# Patient Record
Sex: Male | Born: 1940 | Hispanic: No | Marital: Married | Smoking: Never smoker
Health system: Southern US, Community
[De-identification: ages and names within clinical notes are randomized; demographics above are authoritative.]

## PROBLEM LIST (undated history)

## (undated) DIAGNOSIS — C189 Malignant neoplasm of colon, unspecified: Secondary | ICD-10-CM

## (undated) DIAGNOSIS — E119 Type 2 diabetes mellitus without complications: Secondary | ICD-10-CM

## (undated) DIAGNOSIS — E785 Hyperlipidemia, unspecified: Secondary | ICD-10-CM

## (undated) DIAGNOSIS — I10 Essential (primary) hypertension: Secondary | ICD-10-CM

---

## 2006-05-06 ENCOUNTER — Ambulatory Visit (HOSPITAL_COMMUNITY): Admission: RE | Admit: 2006-05-06 | Discharge: 2006-05-06 | Payer: Self-pay | Admitting: General Surgery

## 2019-06-29 DIAGNOSIS — E1169 Type 2 diabetes mellitus with other specified complication: Secondary | ICD-10-CM

## 2019-06-29 DIAGNOSIS — I10 Essential (primary) hypertension: Secondary | ICD-10-CM | POA: Diagnosis not present

## 2019-06-29 DIAGNOSIS — E162 Hypoglycemia, unspecified: Secondary | ICD-10-CM

## 2019-06-29 DIAGNOSIS — E860 Dehydration: Secondary | ICD-10-CM

## 2019-06-29 DIAGNOSIS — I951 Orthostatic hypotension: Secondary | ICD-10-CM

## 2019-06-29 DIAGNOSIS — R531 Weakness: Secondary | ICD-10-CM

## 2019-06-30 DIAGNOSIS — E162 Hypoglycemia, unspecified: Secondary | ICD-10-CM | POA: Diagnosis not present

## 2019-06-30 DIAGNOSIS — I10 Essential (primary) hypertension: Secondary | ICD-10-CM | POA: Diagnosis not present

## 2019-06-30 DIAGNOSIS — R531 Weakness: Secondary | ICD-10-CM | POA: Diagnosis not present

## 2019-06-30 DIAGNOSIS — I951 Orthostatic hypotension: Secondary | ICD-10-CM | POA: Diagnosis not present

## 2019-07-01 DIAGNOSIS — I10 Essential (primary) hypertension: Secondary | ICD-10-CM | POA: Diagnosis not present

## 2019-07-01 DIAGNOSIS — R531 Weakness: Secondary | ICD-10-CM | POA: Diagnosis not present

## 2019-07-01 DIAGNOSIS — E162 Hypoglycemia, unspecified: Secondary | ICD-10-CM | POA: Diagnosis not present

## 2019-07-01 DIAGNOSIS — I951 Orthostatic hypotension: Secondary | ICD-10-CM | POA: Diagnosis not present

## 2019-07-02 DIAGNOSIS — E162 Hypoglycemia, unspecified: Secondary | ICD-10-CM | POA: Diagnosis not present

## 2019-07-02 DIAGNOSIS — R531 Weakness: Secondary | ICD-10-CM | POA: Diagnosis not present

## 2019-07-02 DIAGNOSIS — I10 Essential (primary) hypertension: Secondary | ICD-10-CM | POA: Diagnosis not present

## 2019-07-02 DIAGNOSIS — I951 Orthostatic hypotension: Secondary | ICD-10-CM | POA: Diagnosis not present

## 2019-07-03 DIAGNOSIS — I951 Orthostatic hypotension: Secondary | ICD-10-CM | POA: Diagnosis not present

## 2019-07-03 DIAGNOSIS — I10 Essential (primary) hypertension: Secondary | ICD-10-CM | POA: Diagnosis not present

## 2019-07-03 DIAGNOSIS — R531 Weakness: Secondary | ICD-10-CM | POA: Diagnosis not present

## 2019-07-03 DIAGNOSIS — E162 Hypoglycemia, unspecified: Secondary | ICD-10-CM | POA: Diagnosis not present

## 2019-07-04 ENCOUNTER — Encounter (HOSPITAL_COMMUNITY): Payer: Self-pay | Admitting: Internal Medicine

## 2019-07-04 ENCOUNTER — Inpatient Hospital Stay (HOSPITAL_COMMUNITY)
Admission: EM | Admit: 2019-07-04 | Discharge: 2019-07-30 | DRG: 207 | Disposition: E | Payer: Medicare HMO | Source: Other Acute Inpatient Hospital | Attending: Critical Care Medicine | Admitting: Critical Care Medicine

## 2019-07-04 DIAGNOSIS — Z9289 Personal history of other medical treatment: Secondary | ICD-10-CM

## 2019-07-04 DIAGNOSIS — Z9911 Dependence on respirator [ventilator] status: Secondary | ICD-10-CM

## 2019-07-04 DIAGNOSIS — F05 Delirium due to known physiological condition: Secondary | ICD-10-CM

## 2019-07-04 DIAGNOSIS — Z7189 Other specified counseling: Secondary | ICD-10-CM | POA: Diagnosis not present

## 2019-07-04 DIAGNOSIS — T380X5A Adverse effect of glucocorticoids and synthetic analogues, initial encounter: Secondary | ICD-10-CM | POA: Diagnosis not present

## 2019-07-04 DIAGNOSIS — R6521 Severe sepsis with septic shock: Secondary | ICD-10-CM | POA: Diagnosis not present

## 2019-07-04 DIAGNOSIS — A414 Sepsis due to anaerobes: Secondary | ICD-10-CM | POA: Diagnosis not present

## 2019-07-04 DIAGNOSIS — Z781 Physical restraint status: Secondary | ICD-10-CM

## 2019-07-04 DIAGNOSIS — Z6833 Body mass index (BMI) 33.0-33.9, adult: Secondary | ICD-10-CM | POA: Diagnosis not present

## 2019-07-04 DIAGNOSIS — J150 Pneumonia due to Klebsiella pneumoniae: Secondary | ICD-10-CM | POA: Diagnosis not present

## 2019-07-04 DIAGNOSIS — Z0189 Encounter for other specified special examinations: Secondary | ICD-10-CM

## 2019-07-04 DIAGNOSIS — A419 Sepsis, unspecified organism: Secondary | ICD-10-CM | POA: Diagnosis not present

## 2019-07-04 DIAGNOSIS — I4891 Unspecified atrial fibrillation: Secondary | ICD-10-CM | POA: Diagnosis not present

## 2019-07-04 DIAGNOSIS — G9341 Metabolic encephalopathy: Secondary | ICD-10-CM | POA: Diagnosis present

## 2019-07-04 DIAGNOSIS — N179 Acute kidney failure, unspecified: Secondary | ICD-10-CM | POA: Diagnosis present

## 2019-07-04 DIAGNOSIS — Z85038 Personal history of other malignant neoplasm of large intestine: Secondary | ICD-10-CM | POA: Diagnosis not present

## 2019-07-04 DIAGNOSIS — I10 Essential (primary) hypertension: Secondary | ICD-10-CM | POA: Diagnosis present

## 2019-07-04 DIAGNOSIS — U071 COVID-19: Principal | ICD-10-CM | POA: Diagnosis present

## 2019-07-04 DIAGNOSIS — D649 Anemia, unspecified: Secondary | ICD-10-CM | POA: Diagnosis not present

## 2019-07-04 DIAGNOSIS — E785 Hyperlipidemia, unspecified: Secondary | ICD-10-CM | POA: Diagnosis present

## 2019-07-04 DIAGNOSIS — Z515 Encounter for palliative care: Secondary | ICD-10-CM | POA: Diagnosis not present

## 2019-07-04 DIAGNOSIS — I951 Orthostatic hypotension: Secondary | ICD-10-CM | POA: Diagnosis not present

## 2019-07-04 DIAGNOSIS — E1165 Type 2 diabetes mellitus with hyperglycemia: Secondary | ICD-10-CM | POA: Diagnosis present

## 2019-07-04 DIAGNOSIS — E8881 Metabolic syndrome: Secondary | ICD-10-CM | POA: Diagnosis present

## 2019-07-04 DIAGNOSIS — E1140 Type 2 diabetes mellitus with diabetic neuropathy, unspecified: Secondary | ICD-10-CM | POA: Diagnosis present

## 2019-07-04 DIAGNOSIS — E162 Hypoglycemia, unspecified: Secondary | ICD-10-CM | POA: Diagnosis not present

## 2019-07-04 DIAGNOSIS — Z66 Do not resuscitate: Secondary | ICD-10-CM | POA: Diagnosis not present

## 2019-07-04 DIAGNOSIS — E669 Obesity, unspecified: Secondary | ICD-10-CM | POA: Diagnosis present

## 2019-07-04 DIAGNOSIS — E870 Hyperosmolality and hypernatremia: Secondary | ICD-10-CM | POA: Diagnosis not present

## 2019-07-04 DIAGNOSIS — E11649 Type 2 diabetes mellitus with hypoglycemia without coma: Secondary | ICD-10-CM | POA: Diagnosis present

## 2019-07-04 DIAGNOSIS — R001 Bradycardia, unspecified: Secondary | ICD-10-CM | POA: Diagnosis not present

## 2019-07-04 DIAGNOSIS — R0902 Hypoxemia: Secondary | ICD-10-CM

## 2019-07-04 DIAGNOSIS — J1282 Pneumonia due to coronavirus disease 2019: Secondary | ICD-10-CM | POA: Diagnosis present

## 2019-07-04 DIAGNOSIS — Z4659 Encounter for fitting and adjustment of other gastrointestinal appliance and device: Secondary | ICD-10-CM

## 2019-07-04 DIAGNOSIS — E874 Mixed disorder of acid-base balance: Secondary | ICD-10-CM | POA: Diagnosis not present

## 2019-07-04 DIAGNOSIS — D6489 Other specified anemias: Secondary | ICD-10-CM | POA: Diagnosis not present

## 2019-07-04 DIAGNOSIS — J9690 Respiratory failure, unspecified, unspecified whether with hypoxia or hypercapnia: Secondary | ICD-10-CM

## 2019-07-04 DIAGNOSIS — E119 Type 2 diabetes mellitus without complications: Secondary | ICD-10-CM

## 2019-07-04 DIAGNOSIS — J9602 Acute respiratory failure with hypercapnia: Secondary | ICD-10-CM | POA: Diagnosis not present

## 2019-07-04 DIAGNOSIS — J80 Acute respiratory distress syndrome: Secondary | ICD-10-CM | POA: Diagnosis present

## 2019-07-04 DIAGNOSIS — J9601 Acute respiratory failure with hypoxia: Secondary | ICD-10-CM | POA: Diagnosis not present

## 2019-07-04 DIAGNOSIS — E875 Hyperkalemia: Secondary | ICD-10-CM | POA: Diagnosis not present

## 2019-07-04 DIAGNOSIS — Z452 Encounter for adjustment and management of vascular access device: Secondary | ICD-10-CM

## 2019-07-04 DIAGNOSIS — R531 Weakness: Secondary | ICD-10-CM | POA: Diagnosis not present

## 2019-07-04 DIAGNOSIS — J156 Pneumonia due to other aerobic Gram-negative bacteria: Secondary | ICD-10-CM | POA: Diagnosis not present

## 2019-07-04 DIAGNOSIS — J398 Other specified diseases of upper respiratory tract: Secondary | ICD-10-CM

## 2019-07-04 DIAGNOSIS — Z978 Presence of other specified devices: Secondary | ICD-10-CM

## 2019-07-04 DIAGNOSIS — R579 Shock, unspecified: Secondary | ICD-10-CM | POA: Diagnosis not present

## 2019-07-04 DIAGNOSIS — K5900 Constipation, unspecified: Secondary | ICD-10-CM | POA: Diagnosis not present

## 2019-07-04 DIAGNOSIS — J9600 Acute respiratory failure, unspecified whether with hypoxia or hypercapnia: Secondary | ICD-10-CM

## 2019-07-04 HISTORY — DX: Type 2 diabetes mellitus without complications: E11.9

## 2019-07-04 HISTORY — DX: Essential (primary) hypertension: I10

## 2019-07-04 HISTORY — DX: Hyperlipidemia, unspecified: E78.5

## 2019-07-04 HISTORY — DX: Malignant neoplasm of colon, unspecified: C18.9

## 2019-07-04 LAB — HEMOGLOBIN A1C
Hgb A1c MFr Bld: 8 % — ABNORMAL HIGH (ref 4.8–5.6)
Mean Plasma Glucose: 182.9 mg/dL

## 2019-07-04 LAB — GLUCOSE, CAPILLARY
Glucose-Capillary: 244 mg/dL — ABNORMAL HIGH (ref 70–99)
Glucose-Capillary: 200 mg/dL — ABNORMAL HIGH (ref 70–99)
Glucose-Capillary: 205 mg/dL — ABNORMAL HIGH (ref 70–99)
Glucose-Capillary: 221 mg/dL — ABNORMAL HIGH (ref 70–99)

## 2019-07-04 LAB — CBC WITH DIFFERENTIAL/PLATELET
Abs Immature Granulocytes: 0.11 10*3/uL — ABNORMAL HIGH (ref 0.00–0.07)
Basophils Absolute: 0 10*3/uL (ref 0.0–0.1)
Basophils Relative: 0 %
Eosinophils Absolute: 0 10*3/uL (ref 0.0–0.5)
Eosinophils Relative: 0 %
HCT: 37 % — ABNORMAL LOW (ref 39.0–52.0)
Hemoglobin: 12 g/dL — ABNORMAL LOW (ref 13.0–17.0)
Immature Granulocytes: 3 %
Lymphocytes Relative: 16 %
Lymphs Abs: 0.6 10*3/uL — ABNORMAL LOW (ref 0.7–4.0)
MCH: 31.2 pg (ref 26.0–34.0)
MCHC: 32.4 g/dL (ref 30.0–36.0)
MCV: 96.1 fL (ref 80.0–100.0)
Monocytes Absolute: 0.3 10*3/uL (ref 0.1–1.0)
Monocytes Relative: 7 %
Neutro Abs: 3 10*3/uL (ref 1.7–7.7)
Neutrophils Relative %: 74 %
Platelets: 229 10*3/uL (ref 150–400)
RBC: 3.85 MIL/uL — ABNORMAL LOW (ref 4.22–5.81)
RDW: 14.5 % (ref 11.5–15.5)
WBC: 4.1 10*3/uL (ref 4.0–10.5)
nRBC: 0.5 % — ABNORMAL HIGH (ref 0.0–0.2)

## 2019-07-04 LAB — COMPREHENSIVE METABOLIC PANEL
ALT: 26 U/L (ref 0–44)
AST: 39 U/L (ref 15–41)
Albumin: 2.9 g/dL — ABNORMAL LOW (ref 3.5–5.0)
Alkaline Phosphatase: 40 U/L (ref 38–126)
Anion gap: 10 (ref 5–15)
BUN: 16 mg/dL (ref 8–23)
CO2: 26 mmol/L (ref 22–32)
Calcium: 7.9 mg/dL — ABNORMAL LOW (ref 8.9–10.3)
Chloride: 103 mmol/L (ref 98–111)
Creatinine, Ser: 0.85 mg/dL (ref 0.61–1.24)
GFR calc Af Amer: >60 mL/min (ref >60)
GFR calc non Af Amer: >60 mL/min (ref >60)
Glucose, Bld: 264 mg/dL — ABNORMAL HIGH (ref 70–99)
Potassium: 4.1 mmol/L (ref 3.5–5.1)
Sodium: 139 mmol/L (ref 135–145)
Total Bilirubin: 0.7 mg/dL (ref 0.3–1.2)
Total Protein: 5.9 g/dL — ABNORMAL LOW (ref 6.5–8.1)

## 2019-07-04 LAB — D-DIMER, QUANTITATIVE: D-Dimer, Quant: 0.47 ug/mL-FEU (ref 0.00–0.50)

## 2019-07-04 LAB — PROCALCITONIN: Procalcitonin: <0.1 ng/mL

## 2019-07-04 LAB — ABO/RH: ABO/RH(D): B POS

## 2019-07-04 LAB — MRSA PCR SCREENING: MRSA by PCR: NEGATIVE

## 2019-07-04 LAB — C-REACTIVE PROTEIN: CRP: 1.6 mg/dL — ABNORMAL HIGH (ref <1.0)

## 2019-07-04 MED ORDER — HYDROCOD POLST-CPM POLST ER 10-8 MG/5ML PO SUER: 5.0000 mL | Freq: Two times a day (BID) | ORAL | Status: DC | PRN

## 2019-07-04 MED ORDER — ACETAMINOPHEN 325 MG PO TABS: 650.0000 mg | ORAL_TABLET | Freq: Four times a day (QID) | ORAL | Status: DC | PRN

## 2019-07-04 MED ORDER — ORAL CARE MOUTH RINSE
15.0000 mL | Freq: Two times a day (BID) | OROMUCOSAL | Status: DC
  Administered 2019-07-04 – 2019-07-07 (×6): 15 mL via OROMUCOSAL

## 2019-07-04 MED ORDER — SODIUM CHLORIDE 0.9 % IV SOLN: 100.0000 mg | Freq: Every day | INTRAVENOUS | Status: DC

## 2019-07-04 MED ORDER — ONDANSETRON HCL 4 MG/2ML IJ SOLN: 4.0000 mg | Freq: Four times a day (QID) | INTRAMUSCULAR | Status: DC | PRN

## 2019-07-04 MED ORDER — SODIUM CHLORIDE 0.9 % IV SOLN: 100.0000 mg | INTRAVENOUS | Status: DC

## 2019-07-04 MED ORDER — GUAIFENESIN-DM 100-10 MG/5ML PO SYRP: 10.0000 mL | ORAL_SOLUTION | ORAL | Status: DC | PRN

## 2019-07-04 MED ORDER — ONDANSETRON HCL 4 MG PO TABS: 4.0000 mg | ORAL_TABLET | Freq: Four times a day (QID) | ORAL | Status: DC | PRN

## 2019-07-04 MED ORDER — ENOXAPARIN SODIUM 60 MG/0.6ML ~~LOC~~ SOLN
0.5000 mg/kg | Freq: Two times a day (BID) | SUBCUTANEOUS | Status: DC
  Administered 2019-07-04 – 2019-07-23 (×39): 60 mg via SUBCUTANEOUS
  Filled 2019-07-04 (×40): qty 0.6

## 2019-07-04 MED ORDER — CHLORHEXIDINE GLUCONATE 0.12 % MT SOLN
15.0000 mL | Freq: Two times a day (BID) | OROMUCOSAL | Status: DC
  Administered 2019-07-04 – 2019-07-07 (×5): 15 mL via OROMUCOSAL
  Filled 2019-07-04 (×6): qty 15

## 2019-07-04 MED ORDER — SODIUM CHLORIDE 0.9 % IV SOLN
INTRAVENOUS | Status: DC | PRN
  Administered 2019-07-04: 1000 mL via INTRAVENOUS
  Administered 2019-07-08: 11:00:00 250 mL via INTRAVENOUS
  Administered 2019-07-14: 500 mL via INTRAVENOUS

## 2019-07-04 MED ORDER — CHLORHEXIDINE GLUCONATE CLOTH 2 % EX PADS
6.0000 | MEDICATED_PAD | Freq: Every day | CUTANEOUS | Status: DC
  Administered 2019-07-04 – 2019-07-23 (×19): 6 via TOPICAL

## 2019-07-04 MED ORDER — HALOPERIDOL LACTATE 5 MG/ML IJ SOLN
2.0000 mg | Freq: Once | INTRAMUSCULAR | Status: AC
  Administered 2019-07-04: 2 mg via INTRAVENOUS
  Filled 2019-07-04: qty 1

## 2019-07-04 MED ORDER — INSULIN ASPART 100 UNIT/ML ~~LOC~~ SOLN
0.0000 [IU] | SUBCUTANEOUS | Status: DC
  Administered 2019-07-04: 5 [IU] via SUBCUTANEOUS
  Administered 2019-07-04: 3 [IU] via SUBCUTANEOUS
  Administered 2019-07-04 – 2019-07-05 (×3): 5 [IU] via SUBCUTANEOUS
  Administered 2019-07-05 (×3): 3 [IU] via SUBCUTANEOUS
  Administered 2019-07-05: 5 [IU] via SUBCUTANEOUS
  Administered 2019-07-05 (×2): 2 [IU] via SUBCUTANEOUS
  Administered 2019-07-06: 8 [IU] via SUBCUTANEOUS
  Administered 2019-07-06: 5 [IU] via SUBCUTANEOUS
  Administered 2019-07-06 (×4): 8 [IU] via SUBCUTANEOUS
  Administered 2019-07-07: 11 [IU] via SUBCUTANEOUS
  Administered 2019-07-07: 5 [IU] via SUBCUTANEOUS

## 2019-07-04 MED ORDER — SODIUM CHLORIDE 0.9 % IV SOLN
100.0000 mg | Freq: Once | INTRAVENOUS | Status: AC
  Administered 2019-07-04: 100 mg via INTRAVENOUS
  Filled 2019-07-04: qty 20

## 2019-07-04 MED ORDER — HYDRALAZINE HCL 20 MG/ML IJ SOLN
5.0000 mg | Freq: Four times a day (QID) | INTRAMUSCULAR | Status: DC | PRN
  Administered 2019-07-05: 5 mg via INTRAVENOUS
  Filled 2019-07-04: qty 1

## 2019-07-04 MED ORDER — PRAVASTATIN SODIUM 20 MG PO TABS
40.0000 mg | ORAL_TABLET | Freq: Every day | ORAL | Status: DC
  Administered 2019-07-04 – 2019-07-07 (×3): 40 mg via ORAL
  Filled 2019-07-04 (×4): qty 2

## 2019-07-04 MED ORDER — DEXAMETHASONE SODIUM PHOSPHATE 10 MG/ML IJ SOLN
6.0000 mg | INTRAMUSCULAR | Status: AC
  Administered 2019-07-04 – 2019-07-13 (×10): 6 mg via INTRAVENOUS
  Filled 2019-07-04 (×10): qty 1

## 2019-07-04 NOTE — Progress Notes (Signed)
[facility]  PROGRESS NOTE  Bruce Weaver [id number] DOB: [birth year] DOA: 07/06/2019 PCP: No primary care provider on file. Admit date - 07/19/2019   Admitting Physician Etta Quill, DO  Outpatient Primary MD for the patient is No primary care provider on file.  LOS - 0 Brief Narrative   Bruce Weaver is a 79 y.o. year old male with medical history significant for HTN, T2DM, HLD  who presented on 07/14/2019 as a transfer from [place]. He presented to [facility] on 3/21 with days of generalized weakness and lightheadedness with standing since getting the firs series of COVID vaccine shots  On 06/25/19.   At the ED at [facility] notable for afebrile with BP 99/54 with positive orthostasis and normal oxygen saturation on rom air. Lab work significant for glucose of 65, WBC 2.9, and creatinine of 1.5 CXR with no acute abnormalities since getting the firs series of COVID vaccine shots  On 06/25/19  He was admitted with working diagnosis of hypoglycemia and orthostatic hypotension related to dehydration with AKI for which his home BP meds and oral hypoglycemics were held and given fluids. CT head and MRI brain obtained for weakness, tremors and confusion were unremarkable    Hospital course complicated worsening hypoxia requiring  2L and found to have multifocal pneumonia on CXR initially started on empiric ceftriaxone and azithromycin but given leukopenia and elevated CRP of 36.8 ( (26.8 on 3/5) and LDH of 717 COVID screen was obtained which was positive. He was started on IV decadron and Iv remdesivir on 3/2, Lovenox increased to full dose given significant d-dimer elevation (730 on 07/03/19).  Patient transferred to [facility] due to increasing O2 requirements (from 4 L to 15 L in 24 hours prior to transfer). CTA chest obtained on 3/6 prior to transfer negative for PE and consistent with bilateral ground glass opacities with superimposed consolidation. .    Subjective  Today he states his  breathing is much better. Mild cough occasionally productive. No fevers, no diarrhea, no abdominal pain or chest pain  A & P   Acute hypoxic respiratory failure secondary to COVID Pneumonia, stable Currently on NRB and maintaining sats greater than 90%.  Very comfortable on exam, no tachypnea, no work of breathing.  Inflammatory markers minimally elevated (CRP 1.6, D-dimer within normal limits) - closely monitor respiratory status in SDU, wean O2 as able given normal respiratory effort, low threshold to consult PCCM but given stability will continue to monitor -Encourage proning, nurse to assist, flutter valve and incentive spirometry as able -Continue on remdesivir, Decadron, no current indication for Actemra -Daily CRP, D-dimer -We will de-escalate to prophylactic Lovenox dosing given D-dimer is not elevated  Type 2 diabetes, presented with hypoglycemic episodes, A1c 8 (3/21) Fasting glucose in the 200s, in setting of IV steroid use -Holding home oral hypoglycemics -Continue sliding scale, once able to eat will add mealtime coverage as well, monitor CBGs  Hypertension Admitted with concern for orthostatic hypotension, blood pressure now elevated in the 150s to 170s -Once able to tolerate oral will resume home losartan -IV hydralazine as needed  AKI, resolved Creatinine slightly elevated on admission, now resolved -Held losartan on admission, will resume given elevated BP once able to tolerate p.o.      Family Communication  :  Called daughter Aline Brochure [phone]  Code Status : Full code  Disposition Plan  :  Patient is from home. Anticipated d/c date: 2 to 3 days. Barriers to d/c or necessity for inpatient  status:  Currently requiring high level of oxygen will need to wean to more appropriate regimen before able to go home, receiving an remdesivir and Decadron, may still need further medications if O2 requirements remain high. Consults  : None  Procedures  : None  DVT  Prophylaxis  :  Lovenox-prophylactic dosing  Lab Results  Component Value Date   PLT 229 07/07/2019    Diet :  Diet Order            Diet NPO time specified Except for: Sips with Meds, Ice Chips  Diet effective now               Inpatient Medications Scheduled Meds: . chlorhexidine  15 mL Mouth Rinse BID  . Chlorhexidine Gluconate Cloth  6 each Topical Daily  . dexamethasone (DECADRON) injection  6 mg Intravenous Q24H  . insulin aspart  0-15 Units Subcutaneous Q4H  . mouth rinse  15 mL Mouth Rinse q12n4p  . pravastatin  40 mg Oral q1800   Continuous Infusions: . remdesivir 100 mg in NS 100 mL     PRN Meds:.acetaminophen, chlorpheniramine-HYDROcodone, guaiFENesin-dextromethorphan, ondansetron **OR** ondansetron (ZOFRAN) IV  Antibiotics  :   Anti-infectives (From admission, onward)   Start     Dose/Rate Route Frequency Ordered Stop   07/05/19 1000  remdesivir 100 mg in sodium chloride 0.9 % 100 mL IVPB  Status:  Discontinued     100 mg 200 mL/hr over 30 Minutes Intravenous Daily 07/22/2019 0620 07/24/2019 0637   07/25/2019 1000  remdesivir 100 mg in sodium chloride 0.9 % 100 mL IVPB     100 mg 200 mL/hr over 30 Minutes Intravenous  Once 07/06/2019 0637     07/22/2019 0630  remdesivir 100 mg in sodium chloride 0.9 % 100 mL IVPB  Status:  Discontinued     100 mg 200 mL/hr over 30 Minutes Intravenous Every 1 hr x 2 07/27/2019 0620 07/02/2019 0636       Objective   Vitals:   07/13/2019 0600 07/09/2019 0700 07/17/2019 0800 07/27/2019 0846  BP: (!) 158/57 (!) 149/132 (!) 170/71   Pulse: 80 81 79   Resp: 16 16 (!) 22   Temp: 97.6 F (36.4 C)   98.1 F (36.7 C)  TempSrc: Oral   Oral  SpO2: (!) 88% 95% 93%   Weight: 120.7 kg       SpO2: 93 % O2 Flow Rate (L/min): 15 L/min  Wt Readings from Last 3 Encounters:  07/03/2019 120.7 kg     Intake/Output Summary (Last 24 hours) at 07/13/2019 0853 Last data filed at 07/03/2019 [id number] Gross per 24 hour  Intake --  Output 200 ml  Net -200 ml     Physical Exam:    Older male, resting comfortably in bed Awake Alert, Oriented X 3, Normal affect Moving all extremities without deficits [place].AT, Normal respiratory effort on 15 L NRB with SPO2 range 90-94, fine crackles at bases, no accessory muscle use, able to speak in complete sentences +ve B.Sounds, Abd Soft, No tenderness, No rebound, guarding or rigidity. No Cyanosis, No new Rash or bruise     I have personally reviewed the following:   Data Reviewed:  CBC Recent Labs  Lab 07/27/2019 0724  WBC 4.1  HGB 12.0*  HCT 37.0*  PLT 229  MCV 96.1  MCH 31.2  MCHC 32.4  RDW 14.5  LYMPHSABS 0.6*  MONOABS 0.3  EOSABS 0.0  BASOSABS 0.0    Chemistries  Recent Labs  Lab  07/08/2019 0724  NA 139  K 4.1  CL 103  CO2 26  GLUCOSE 264*  BUN 16  CREATININE 0.85  CALCIUM 7.9*  AST 39  ALT 26  ALKPHOS 40  BILITOT 0.7   ------------------------------------------------------------------------------------------------------------------ No results for input(s): CHOL, HDL, LDLCALC, TRIG, CHOLHDL, LDLDIRECT in the last 72 hours.  No results found for: HGBA1C ------------------------------------------------------------------------------------------------------------------ No results for input(s): TSH, T4TOTAL, T3FREE, THYROIDAB in the last 72 hours.  Invalid input(s): FREET3 ------------------------------------------------------------------------------------------------------------------ No results for input(s): VITAMINB12, FOLATE, FERRITIN, TIBC, IRON, RETICCTPCT in the last 72 hours.  Coagulation profile No results for input(s): INR, PROTIME in the last 168 hours.  Recent Labs    07/22/2019 0724  DDIMER 0.47    Cardiac Enzymes No results for input(s): CKMB, TROPONINI, MYOGLOBIN in the last 168 hours.  Invalid input(s): CK ------------------------------------------------------------------------------------------------------------------ No results found for:  BNP  Micro Results No results found for this or any previous visit (from the past 240 hour(s)).  Radiology Reports No results found.   Time Spent in minutes  30     Desiree Hane M.D on 07/19/2019 at 8:53 AM  To page go to [email or web address] - password [facility]

## 2019-07-04 NOTE — H&P (Signed)
History and Physical    Bruce Weaver [id number] DOB: 1940/10/04 DOA: 07/11/2019  PCP: No primary care provider on file.  Patient coming from: [facility] transfer  I have personally briefly reviewed patient's old medical records in [facility]  Chief Complaint: Worsening O2 requirement, COVID  HPI: Bruce Weaver is a 79 y.o. male with medical history significant of DM2, HTN, HLD.  Pt got COVID vaccine on 2/25.  Generalized weakness, fatigue and symptoms worsening since around that time.  Admitted to [facility] on 3/1.  Found to have multifocal PNA and tested COVID positive on 3/2.  Started on remdesivir and steroids.  CRP hasnt been high enough at [facility] to indicate Actemra.  Unfortunatly patient took a turn for the worse with profound O2 requirement increase over the day yesterday 3/5.  Starting day on 4L and ending day on 15L.  CTA chest done just before transfer was neg for PE, just showed COVID-19 pneumonia.  Patient transferred due to worsening O2 requirements.    Review of Systems: As per HPI, otherwise all review of systems negative.  Past Medical History:  Diagnosis Date  . Colon cancer (Bruce Weaver)   . DM2 (diabetes mellitus, type 2) (Bruce Weaver)   . HLD (hyperlipidemia)   . HTN (hypertension)     History reviewed. No pertinent surgical history.   reports that he has never smoked. He does not have any smokeless tobacco history on file. He reports that he does not drink alcohol or use drugs.  No Known Allergies  Family History  Problem Relation Age of Onset  . Diabetes Other   . Cancer Other   . Heart disease Other      Prior to Admission medications   Not on File    Physical Exam: Vitals:   07/11/2019 0530  SpO2: 92%    Constitutional: NAD, calm, comfortable Eyes: PERRL, lids and conjunctivae normal ENMT: Mucous membranes are moist. Posterior pharynx clear of any exudate or lesions.Normal dentition.  Neck: normal, supple, no masses, no thyromegaly Respiratory: clear  to auscultation bilaterally, no wheezing, no crackles. Normal respiratory effort. No accessory muscle use.  Cardiovascular: Regular rate and rhythm, no murmurs / rubs / gallops. No extremity edema. 2+ pedal pulses. No carotid bruits.  Abdomen: no tenderness, no masses palpated. No hepatosplenomegaly. Bowel sounds positive.  Musculoskeletal: no clubbing / cyanosis. No joint deformity upper and lower extremities. Good ROM, no contractures. Normal muscle tone.  Skin: no rashes, lesions, ulcers. No induration Neurologic: CN 2-12 grossly intact. Sensation intact, DTR normal. Strength 5/5 in all 4.  Psychiatric: Normal judgment and insight. Alert and oriented x 3. Normal mood.    Labs on Admission: I have personally reviewed following labs and imaging studies  CBC: No results for input(s): WBC, NEUTROABS, HGB, HCT, MCV, PLT in the last 168 hours. Basic Metabolic Panel: No results for input(s): NA, K, CL, CO2, GLUCOSE, BUN, CREATININE, CALCIUM, MG, PHOS in the last 168 hours. GFR: CrCl cannot be calculated (No successful lab value found.). Liver Function Tests: No results for input(s): AST, ALT, ALKPHOS, BILITOT, PROT, ALBUMIN in the last 168 hours. No results for input(s): LIPASE, AMYLASE in the last 168 hours. No results for input(s): AMMONIA in the last 168 hours. Coagulation Profile: No results for input(s): INR, PROTIME in the last 168 hours. Cardiac Enzymes: No results for input(s): CKTOTAL, CKMB, CKMBINDEX, TROPONINI in the last 168 hours. BNP (last 3 results) No results for input(s): PROBNP in the last 8760 hours. HbA1C: No results  for input(s): HGBA1C in the last 72 hours. CBG: No results for input(s): GLUCAP in the last 168 hours. Lipid Profile: No results for input(s): CHOL, HDL, LDLCALC, TRIG, CHOLHDL, LDLDIRECT in the last 72 hours. Thyroid Function Tests: No results for input(s): TSH, T4TOTAL, FREET4, T3FREE, THYROIDAB in the last 72 hours. Anemia Panel: No results for  input(s): VITAMINB12, FOLATE, FERRITIN, TIBC, IRON, RETICCTPCT in the last 72 hours. Urine analysis: No results found for: COLORURINE, APPEARANCEUR, LABSPEC, PHURINE, GLUCOSEU, HGBUR, BILIRUBINUR, KETONESUR, PROTEINUR, UROBILINOGEN, NITRITE, LEUKOCYTESUR  Radiological Exams on Admission: No results found.  EKG: Independently reviewed.  Assessment/Plan Principal Problem:   Acute respiratory distress syndrome (ARDS) due to COVID-19 virus (HCC) Active Problems:   DM2 (diabetes mellitus, type 2) (HCC)   HTN (hypertension)    1. ARDS due to COVID-19 1. COVID pathway 2. Satting upper 80s on 15L, no respiratory distress nor tachypnea currently 3. May need addition of HFNC shortly 4. Cont remdesivir 5. Cont Decadron 6. Checking repeat labs 7. If CRP elevated, start Actemra (wasn't high enough to indicate this as of yesterday [facility] labs). 8. CTA chest done just a couple of hours ago showed no PE, just COVID PNA. 9. Likely will want to get PCCM consult when they come in this morning in 45 mins 10. NPO currently but not putting on IVF due to poor respiratory status 2. DM2 - 1. Hold home PO hypoglycemics 2. Mod scale SSI Q4H 3. HTN - 1. Home ARB on hold 4. AKI on admission - 1. Reportedly resolved while at [facility], had mild AKI with creat of 1.5, got hydration, AKI resolved.  DVT prophylaxis: Lovenox - does per COVID protocol by pharmd Code Status: Full Code Family Communication: No family in room Disposition Plan: TBD Consults called: None Admission status: Admit to inpatient  Severity of Illness: The appropriate patient status for this patient is INPATIENT. Inpatient status is judged to be reasonable and necessary in order to provide the required intensity of service to ensure the patient's safety. The patient's presenting symptoms, physical exam findings, and initial radiographic and laboratory data in the context of their chronic comorbidities is felt to place them at high risk for  further clinical deterioration. Furthermore, it is not anticipated that the patient will be medically stable for discharge from the hospital within 2 midnights of admission. The following factors support the patient status of inpatient.   IP status due to acute resp failure (ARDS actually) from COVID-19.  * I certify that at the point of admission it is my clinical judgment that the patient will require inpatient hospital care spanning beyond 2 midnights from the point of admission due to high intensity of service, high risk for further deterioration and high frequency of surveillance required.*    Stewart Pimenta M. DO [facility]  How to contact the [facility] Attending or Consulting provider [place] or covering provider during after hours [place], for this patient?  1. Check the care team in [facility] and look for a) attending/consulting [facility] provider listed and b) the [facility] team listed 2. Log into [email or web address]  Amion Physician Scheduling and messaging for groups and whole hospitals  On call and physician scheduling software for group practices, residents, hospitalists and other medical providers for call, clinic, rotation and shift schedules. On[employer] is a hospital-wide system for scheduling doctors and paging doctors on call. EasyPlot is for scientific plotting and data analysis.  [email or web address]  and use [place]'s universal password to access. If you do  not have the password, please contact the hospital operator.  3. Locate the [facility] provider you are looking for under [facility] and page to a number that you can be directly reached. 4. If you still have difficulty reaching the provider, please page the [facility] (Director on Call) for the Hospitalists listed on amion for assistance.  07/25/2019, 6:14 AM

## 2019-07-04 NOTE — Progress Notes (Signed)
Rx Brief note: Remdesivir and Lovenox  Patient is a transfer from [facility]. I spoke with a pharmacist at [facility] who confirmed today will be his last day of Remdesivir. Was started 3/2 with 200 mg x1 then 100 mg on 3/3, 3/4 and 3/5. Patient was also on full dose Lovenox 120 mg sq q12h. LD was 3/5 2040. Appears Lovenox was increased to full dose when his d-dimer increased on 3/3. His CTA was negative for PE prior to transfer to [facility]. MD has asked pharmacy to dose Lovenox for patient with elevated d-dimer and + Covid.   Plan: F/u d-dimer and order appropriate Lovenox dose based on d-dimer and weight.   Thanks Dorrene German 07/18/2019 7:04 AM

## 2019-07-04 NOTE — Progress Notes (Signed)
Pharmacy Brief Note -   Pharmacy consulted to dose enoxaparin for VTE ppx in COVID + patient.   Pt transferred from [facility] where he was on therapeutic LMWH. D-dimer reportedly peaked there at 1.02. CTA negative for PE. LD LMWH PTA was 3/5 @ 2040.   TBW 120.7 kg D-dimer 0.47  Start enoxaparin 0.5 mg/kg subQ BID for DVT ppx. Discussed with MD - no therapeutic AC indicated at this time.  Lenis Noon, PharmD 07/08/2019 9:53 AM

## 2019-07-05 ENCOUNTER — Inpatient Hospital Stay (HOSPITAL_COMMUNITY): Payer: Medicare HMO

## 2019-07-05 DIAGNOSIS — U071 COVID-19: Principal | ICD-10-CM

## 2019-07-05 DIAGNOSIS — J80 Acute respiratory distress syndrome: Secondary | ICD-10-CM

## 2019-07-05 DIAGNOSIS — F05 Delirium due to known physiological condition: Secondary | ICD-10-CM

## 2019-07-05 LAB — COMPREHENSIVE METABOLIC PANEL
ALT: 32 U/L (ref 0–44)
AST: 55 U/L — ABNORMAL HIGH (ref 15–41)
Albumin: 3.1 g/dL — ABNORMAL LOW (ref 3.5–5.0)
Alkaline Phosphatase: 40 U/L (ref 38–126)
Anion gap: 12 (ref 5–15)
BUN: 15 mg/dL (ref 8–23)
CO2: 24 mmol/L (ref 22–32)
Calcium: 8 mg/dL — ABNORMAL LOW (ref 8.9–10.3)
Chloride: 104 mmol/L (ref 98–111)
Creatinine, Ser: 0.93 mg/dL (ref 0.61–1.24)
GFR calc Af Amer: >60 mL/min (ref >60)
GFR calc non Af Amer: >60 mL/min (ref >60)
Glucose, Bld: 165 mg/dL — ABNORMAL HIGH (ref 70–99)
Potassium: 4.1 mmol/L (ref 3.5–5.1)
Sodium: 140 mmol/L (ref 135–145)
Total Bilirubin: 1.6 mg/dL — ABNORMAL HIGH (ref 0.3–1.2)
Total Protein: 6.2 g/dL — ABNORMAL LOW (ref 6.5–8.1)

## 2019-07-05 LAB — CBC WITH DIFFERENTIAL/PLATELET
Abs Immature Granulocytes: 0.09 10*3/uL — ABNORMAL HIGH (ref 0.00–0.07)
Basophils Absolute: 0 10*3/uL (ref 0.0–0.1)
Basophils Relative: 1 %
Eosinophils Absolute: 0 10*3/uL (ref 0.0–0.5)
Eosinophils Relative: 0 %
HCT: 40 % (ref 39.0–52.0)
Hemoglobin: 12.9 g/dL — ABNORMAL LOW (ref 13.0–17.0)
Immature Granulocytes: 2 %
Lymphocytes Relative: 13 %
Lymphs Abs: 0.7 10*3/uL (ref 0.7–4.0)
MCH: 31.7 pg (ref 26.0–34.0)
MCHC: 32.3 g/dL (ref 30.0–36.0)
MCV: 98.3 fL (ref 80.0–100.0)
Monocytes Absolute: 0.4 10*3/uL (ref 0.1–1.0)
Monocytes Relative: 8 %
Neutro Abs: 4.1 10*3/uL (ref 1.7–7.7)
Neutrophils Relative %: 76 %
Platelets: 221 10*3/uL (ref 150–400)
RBC: 4.07 MIL/uL — ABNORMAL LOW (ref 4.22–5.81)
RDW: 14.2 % (ref 11.5–15.5)
WBC: 5.3 10*3/uL (ref 4.0–10.5)
nRBC: 0.8 % — ABNORMAL HIGH (ref 0.0–0.2)

## 2019-07-05 LAB — GLUCOSE, CAPILLARY
Glucose-Capillary: 141 mg/dL — ABNORMAL HIGH (ref 70–99)
Glucose-Capillary: 148 mg/dL — ABNORMAL HIGH (ref 70–99)
Glucose-Capillary: 151 mg/dL — ABNORMAL HIGH (ref 70–99)
Glucose-Capillary: 177 mg/dL — ABNORMAL HIGH (ref 70–99)
Glucose-Capillary: 198 mg/dL — ABNORMAL HIGH (ref 70–99)
Glucose-Capillary: 229 mg/dL — ABNORMAL HIGH (ref 70–99)
Glucose-Capillary: 245 mg/dL — ABNORMAL HIGH (ref 70–99)

## 2019-07-05 LAB — BLOOD GAS, ARTERIAL
Acid-Base Excess: 3.8 mmol/L — ABNORMAL HIGH (ref 0.0–2.0)
Bicarbonate: 27.4 mmol/L (ref 20.0–28.0)
FIO2: 100
O2 Content: 15 L/min
O2 Saturation: 86.4 %
Patient temperature: 99.4
pCO2 arterial: 40.1 mmHg (ref 32.0–48.0)
pH, Arterial: 7.452 — ABNORMAL HIGH (ref 7.350–7.450)
pO2, Arterial: 52.9 mmHg — ABNORMAL LOW (ref 83.0–108.0)

## 2019-07-05 LAB — D-DIMER, QUANTITATIVE: D-Dimer, Quant: 0.89 ug/mL-FEU — ABNORMAL HIGH (ref 0.00–0.50)

## 2019-07-05 LAB — C-REACTIVE PROTEIN: CRP: 1.8 mg/dL — ABNORMAL HIGH (ref <1.0)

## 2019-07-05 MED ORDER — LOSARTAN POTASSIUM 50 MG PO TABS
50.0000 mg | ORAL_TABLET | Freq: Every day | ORAL | Status: DC
  Administered 2019-07-05 – 2019-07-07 (×2): 50 mg via ORAL
  Filled 2019-07-05 (×3): qty 1

## 2019-07-05 MED ORDER — FUROSEMIDE 10 MG/ML IJ SOLN
40.0000 mg | Freq: Two times a day (BID) | INTRAMUSCULAR | Status: DC
  Administered 2019-07-05 – 2019-07-06 (×4): 40 mg via INTRAVENOUS
  Filled 2019-07-05 (×4): qty 4

## 2019-07-05 MED ORDER — RAMELTEON 8 MG PO TABS
8.0000 mg | ORAL_TABLET | Freq: Every day | ORAL | Status: DC
  Administered 2019-07-06 – 2019-07-07 (×2): 8 mg via ORAL
  Filled 2019-07-05 (×3): qty 1

## 2019-07-05 MED ORDER — DEXMEDETOMIDINE HCL IN NACL 200 MCG/50ML IV SOLN
0.4000 ug/kg/h | INTRAVENOUS | Status: DC
  Administered 2019-07-05 (×3): 0.4 ug/kg/h via INTRAVENOUS
  Administered 2019-07-06: 0.2 ug/kg/h via INTRAVENOUS
  Administered 2019-07-06 (×3): 0.4 ug/kg/h via INTRAVENOUS
  Administered 2019-07-07 (×3): 0.2 ug/kg/h via INTRAVENOUS
  Filled 2019-07-05 (×10): qty 50

## 2019-07-05 MED ORDER — FAMOTIDINE IN NACL 20-0.9 MG/50ML-% IV SOLN
20.0000 mg | INTRAVENOUS | Status: DC
  Administered 2019-07-05 – 2019-07-16 (×12): 20 mg via INTRAVENOUS
  Filled 2019-07-05 (×12): qty 50

## 2019-07-05 NOTE — Progress Notes (Signed)
Pt became very agitated when RT tried to wean his oxygen. Pt didn't want anyone touching around his face, RT will continue to monitor

## 2019-07-05 NOTE — Consult Note (Signed)
NAME:  Bruce Weaver, MRN:  [id number], DOB:  04-13-1941, LOS: 1 ADMISSION DATE:  07/13/2019, CONSULTATION DATE:  07/05/19 REFERRING MD:  Lonny Prude, CHIEF COMPLAINT:  SOB   Brief History   79 year old man with metabolic syndrome presenting with severe COVID pneumonia.  History of present illness   79 year old man with metabolic syndrome presenting with severe COVID pneumonia.  Transferred from [place].  Diagnosed on 3/2.  Progressive hypoxemia and delirium prompting transfer to [facility].  In stepdown with higher O2 needs so PCCM consulted. Patient is AO to self only.  ROS as below with this caveat.  Past Medical History  DM2 HTN HLD Obesity  Significant Hospital Events   3/1 admitted to [facility] 3/2 diagnosed with COVID 3/5 4L to 15L, transferred to [facility] 3/6 PCCM consult  Consults:  PCCM  Procedures:  N/A  Significant Diagnostic Tests:  CXR- multifocal pneumonia CTA OSH neg for PE  Micro Data:  CRP low D dimer pretty low Pct neg  Antimicrobials:  Remdesivir   Interim history/subjective:  Consulted.  Patient states he is 'fixing to take a shower'.  Objective   Blood pressure (!) 160/64, pulse 99, temperature 98 F (36.7 C), temperature source Axillary, resp. rate (!) 23, weight 120.7 kg, SpO2 99 %.    FiO2 (%):  [100 %] 100 %   Intake/Output Summary (Last 24 hours) at 07/05/2019 0901 Last data filed at 07/15/2019 2000 Gross per 24 hour  Intake 2.42 ml  Output 870 ml  Net -867.58 ml   Filed Weights   07/02/2019 0600  Weight: 120.7 kg    Examination: GEN: obese confused elderly man laying in bed HEENT: MM dry, [place] in place CV: RRR, ext warm PULM: Suprisingly clear, no accessory muscle use GI: Soft, +BS EXT: No edema NEURO: moves all 4 ext to command PSYCH: RASS 0 SKIN: No rashes   Resolved Hospital Problem list   N/A  Assessment & Plan:  Acute hypoxemic respiratory failure due to COVID pneumonia - Remdesivir/steroids for usual course - Patient cannot  prone nor participate well with IS due to delirium - Inflammatory markers are not very elevated, agree with holding off on actemra - Trial of lasix fine, watch BMP - Decision to intubate is based on clinical gestalt more than oxygen saturations for COVID patients.  Would try to avoid if at all possible in this man, he will not do well on vent.  Acute metabolic encephalopathy AKA delirium related to critical illness and steroids - Re orient as able, lights on during day, lights off at night - Mittens and wrist restraints to prevent him pulling off oxygen and endangering life - qHS ramelteon - Can use precedex if run into issues  Best practice:  Diet: I am okay with him eating, if he looks like he is frankly aspirating then we will need to do a cortak Pain/Anxiety/Delirium protocol (if indicated): See above VAP protocol (if indicated): N/A DVT prophylaxis: 0.5 mg/kg lovenox BID GI prophylaxis: would do pepcid while on AC and steroids Glucose control: per primary Mobility: BR Code Status: Full Family Communication: per primary Disposition:  ICU given tenuous respiratory status  Labs   CBC: Recent Labs  Lab 07/03/2019 0724 07/05/19 0258  WBC 4.1 5.3  NEUTROABS 3.0 4.1  HGB 12.0* 12.9*  HCT 37.0* 40.0  MCV 96.1 98.3  PLT 229 [id number]    Basic Metabolic Panel: Recent Labs  Lab 07/01/2019 0724 07/05/19 0258  NA 139 140  K 4.1  4.1  CL 103 104  CO2 26 24  GLUCOSE 264* 165*  BUN 16 15  CREATININE 0.85 0.93  CALCIUM 7.9* 8.0*   GFR: CrCl cannot be calculated (Unknown ideal weight.). Recent Labs  Lab 07/15/2019 0724 07/05/19 0258  PROCALCITON <0.10  --   WBC 4.1 5.3    Liver Function Tests: Recent Labs  Lab 07/05/2019 0724 07/05/19 0258  AST 39 55*  ALT 26 32  ALKPHOS 40 40  BILITOT 0.7 1.6*  PROT 5.9* 6.2*  ALBUMIN 2.9* 3.1*   No results for input(s): LIPASE, AMYLASE in the last 168 hours. No results for input(s): AMMONIA in the last 168 hours.  ABG    Component  Value Date/Time   PHART 7.452 (H) 07/05/2019 0812   PCO2ART 40.1 07/05/2019 0812   PO2ART 52.9 (L) 07/05/2019 0812   HCO3 27.4 07/05/2019 0812   O2SAT 86.4 07/05/2019 0812     Coagulation Profile: No results for input(s): INR, PROTIME in the last 168 hours.  Cardiac Enzymes: No results for input(s): CKTOTAL, CKMB, CKMBINDEX, TROPONINI in the last 168 hours.  HbA1C: Hgb A1c MFr Bld  Date/Time Value Ref Range Status  07/20/2019 07:24 AM 8.0 (H) 4.8 - 5.6 % Final    Comment:    (NOTE) Pre diabetes:          5.7%-6.4% Diabetes:              >6.4% Glycemic control for   <7.0% adults with diabetes     CBG: Recent Labs  Lab 07/09/2019 1553 07/14/2019 2008 07/05/19 0022 07/05/19 0306 07/05/19 0756  GLUCAP 205* 221* 151* 141* 177*    Review of Systems:    Positive Symptoms in bold:  Constitutional fevers, chills, weight loss, fatigue, anorexia, malaise  Eyes decreased vision, double vision, eye irritation  Ears, Nose, Mouth, Throat sore throat, trouble swallowing, sinus congestion  Cardiovascular chest pain, paroxysmal nocturnal dyspnea, lower ext edema, palpitations   Respiratory SOB, cough, DOE, hemoptysis, wheezing  Gastrointestinal nausea, vomiting, diarrhea  Genitourinary burning with urination, trouble urinating  Musculoskeletal joint aches, joint swelling, back pain  Integumentary  rashes, skin lesions  Neurological focal weakness, focal numbness, trouble speaking, headaches  Psychiatric depression, anxiety, confusion  Endocrine polyuria, polydipsia, cold intolerance, heat intolerance  Hematologic abnormal bruising, abnormal bleeding, unexplained nose bleeds  Allergic/Immunologic recurrent infections, hives, swollen lymph nodes     Past Medical History  He,  has a past medical history of Colon cancer ([place]), DM2 (diabetes mellitus, type 2) ([place]), HLD (hyperlipidemia), and HTN (hypertension).   Surgical History   History reviewed. No pertinent surgical history.    Social History   reports that he has never smoked. He does not have any smokeless tobacco history on file. He reports that he does not drink alcohol or use drugs.   Family History   His family history includes Cancer in an other family member; Diabetes in an other family member; Heart disease in an other family member.   Allergies No Known Allergies   Home Medications  Prior to Admission medications   Medication Sig Start Date End Date Taking? Authorizing Provider  acetaminophen (TYLENOL) 325 MG tablet Take 650 mg by mouth every 6 (six) hours as needed for moderate pain.   Yes [provider]  glipiZIDE (GLUCOTROL XL) 10 MG 24 hr tablet Take 10 mg by mouth in the morning and at bedtime.   Yes [provider]  hydroxypropyl methylcellulose / hypromellose (ISOPTO TEARS / GONIOVISC) 2.5 %  ophthalmic solution Place 1 drop into both eyes 3 (three) times daily as needed for dry eyes.   Yes [provider]  losartan (COZAAR) 50 MG tablet Take 50 mg by mouth daily.   Yes [provider]  metFORMIN (GLUCOPHAGE) 1000 MG tablet Take 1,000 mg by mouth 2 (two) times daily with a meal.   Yes [provider]  pioglitazone (ACTOS) 45 MG tablet Take 45 mg by mouth daily.   Yes [provider]  pravastatin (PRAVACHOL) 40 MG tablet Take 40 mg by mouth daily.   Yes [provider]  pregabalin (LYRICA) 100 MG capsule Take 100 mg by mouth 3 (three) times daily.   Yes [provider]  sitaGLIPtin (JANUVIA) 100 MG tablet Take 100 mg by mouth daily.   Yes [provider]     Critical care time: 34 minutes

## 2019-07-05 NOTE — Progress Notes (Signed)
15L PNR AND 6L SALTER SATS 100%

## 2019-07-05 NOTE — Progress Notes (Signed)
[facility]  PROGRESS NOTE  Bruce Weaver [id number] DOB: 30-Nov-1940 DOA: 07/01/2019 PCP: No primary care provider on file. Admit date - 07/12/2019   Admitting Physician Etta Quill, DO  Outpatient Primary MD for the patient is No primary care provider on file.  LOS - 1 Brief Narrative   Bruce Weaver is a 79 y.o. year old male with medical history significant for HTN, T2DM, HLD  who presented on 07/25/2019 as a transfer from [place]. He presented to [place] on 3/21 with days of generalized weakness and lightheadedness with standing since getting the firs series of COVID vaccine shots  On 06/25/19.   At the ED at [facility] notable for afebrile with BP 99/54 with positive orthostasis and normal oxygen saturation on rom air. Lab work significant for glucose of 65, WBC 2.9, and creatinine of 1.5 CXR with no acute abnormalities since getting the firs series of COVID vaccine shots  On 06/25/19  He was admitted with working diagnosis of hypoglycemia and orthostatic hypotension related to dehydration with AKI for which his home BP meds and oral hypoglycemics were held and given fluids. CT head and MRI brain obtained for weakness, tremors and confusion were unremarkable    Hospital course at [place]  complicated worsening hypoxia requiring  2L and found to have multifocal pneumonia on CXR initially started on empiric ceftriaxone and azithromycin but given leukopenia and elevated CRP of 36.8 ( (26.8 on 3/5) and LDH of 717 COVID screen was obtained which was positive. He was started on IV decadron and Iv remdesivir on 3/2, Lovenox increased to full dose given significant d-dimer elevation (730 on 07/03/19).  Patient transferred to [facility] due to increasing O2 requirements (from 4 L to 15 L in 24 hours prior to transfer). CTA chest obtained on 3/6 prior to transfer negative for PE and consistent with bilateral ground glass opacities with superimposed consolidation.   Since being at [facility] patient  has been on 15 L NRB with good oxygen saturation. His course has been complicated by some increased restlessness/delirium and persistent need for O2    Subjective  Today he states his breathing is much better. Mild cough occasionally productive. No fevers, no diarrhea, no abdominal pain or chest pain  A & P   Acute hypoxic respiratory failure secondary to COVID Pneumonia and mild ARDS, stable Currently on NRB and maintaining sats greater than 92%.  Very comfortable on exam, no tachypnea, no work of breathing.  Inflammatory markers minimally elevated (CRP 1.6, D-dimer within normal limits) - Repeat CXR shows b/l opacities consistent with COVID PNa, PE ruled out.  --Remdesivir completed, continue IV decadron --Add Iv lasix 40 mg BID --Doesn't seem to meet criteria for Actemra based on inflammatory markers.  --Consult PCCM for further assistance given persistent O2 requirements -Encourage proning, nurse to assist, flutter valve and incentive spirometry as able -Daily CRP, D-dimer - prophylactic Lovenox dosing given D-dimer is not elevated   Increased distractibility and restlessness, likely hospital acquired delirium in setting of infection S/p Haldol and restraints overnight. Today able to reorient. Oriented to self and place. Following commands with no notable deficits -d/c restraints -delirium precautions  Type 2 diabetes, presented with hypoglycemic episodes, A1c 8 (3/21) Fasting glucose in the140s-170s, in setting of IV steroid use -Holding home oral hypoglycemics -Continue sliding scale, once able to eat will add mealtime coverage as well, monitor CBGs  Hypertension Admitted with concern for orthostatic hypotension, blood pressure now elevated in the 150s to 170s -resume  home losartan -IV hydralazine as needed  AKI, resolved Creatinine slightly elevated on admission, now resolved -Held losartan on admission, will resume given elevated BP once able to tolerate  p.o.      Family Communication  :  Called daughter Luetta Nutting [phone] on 3/6, will update today  Code Status : Full code  Disposition Plan  :  Patient is from home. Anticipated d/c date: 3 to 4 days. Barriers to d/c or necessity for inpatient status:  Currently requiring high level of oxygen will need to wean to more appropriate regimen before able to go home, receiving Decadron, may still need further medications if O2 requirements remain high. Consults  : PCCM  Procedures  : None  DVT Prophylaxis  :  Lovenox-prophylactic dosing  Lab Results  Component Value Date   PLT 221 07/05/2019    Diet :  Diet Order            Diet full liquid Room service appropriate? Yes; Fluid consistency: Thin  Diet effective now               Inpatient Medications Scheduled Meds: . chlorhexidine  15 mL Mouth Rinse BID  . Chlorhexidine Gluconate Cloth  6 each Topical Daily  . dexamethasone (DECADRON) injection  6 mg Intravenous Q24H  . enoxaparin (LOVENOX) injection  0.5 mg/kg Subcutaneous Q12H  . furosemide  40 mg Intravenous Q12H  . insulin aspart  0-15 Units Subcutaneous Q4H  . mouth rinse  15 mL Mouth Rinse q12n4p  . pravastatin  40 mg Oral q1800   Continuous Infusions: . sodium chloride Stopped (07/06/2019 1107)   PRN Meds:.sodium chloride, acetaminophen, chlorpheniramine-HYDROcodone, guaiFENesin-dextromethorphan, hydrALAZINE, ondansetron **OR** ondansetron (ZOFRAN) IV  Antibiotics  :   Anti-infectives (From admission, onward)   Start     Dose/Rate Route Frequency Ordered Stop   07/05/19 1000  remdesivir 100 mg in sodium chloride 0.9 % 100 mL IVPB  Status:  Discontinued     100 mg 200 mL/hr over 30 Minutes Intravenous Daily 07/20/2019 0620 07/16/2019 0637   07/28/2019 1000  remdesivir 100 mg in sodium chloride 0.9 % 100 mL IVPB     100 mg 200 mL/hr over 30 Minutes Intravenous  Once 06/29/2019 0637 07/09/2019 1053   07/22/2019 0630  remdesivir 100 mg in sodium chloride 0.9 % 100 mL IVPB   Status:  Discontinued     100 mg 200 mL/hr over 30 Minutes Intravenous Every 1 hr x 2 07/10/2019 0620 07/18/2019 0636       Objective   Vitals:   07/05/19 0100 07/05/19 0200 07/05/19 0400 07/05/19 0825  BP:   (!) 125/55 (!) 160/64  Pulse: (!) 102 (!) 101 (!) 110 99  Resp: (!) 22 20 20  (!) 23  Temp:   98 F (36.7 C)   TempSrc:   Axillary   SpO2: (!) 89% 90% 91% 99%  Weight:        SpO2: 99 % O2 Flow Rate (L/min): 15 L/min FiO2 (%): 100 %  Wt Readings from Last 3 Encounters:  07/03/2019 120.7 kg     Intake/Output Summary (Last 24 hours) at 07/05/2019 0846 Last data filed at 07/28/2019 2000 Gross per 24 hour  Intake 2.42 ml  Output 870 ml  Net -867.58 ml    Physical Exam:    Older male, resting comfortably in bed Awake Alert, Oriented to self, context, not place, restless in bed Moving all extremities without deficits [place].AT, Normal respiratory effort on 15 L NRB with SPO2 range  90-100,  no accessory muscle use, able to speak in complete sentences +ve B.Sounds, Abd Soft, No tenderness, No rebound, guarding or rigidity. No Cyanosis, No new Rash or bruise     I have personally reviewed the following:   Data Reviewed:  CBC Recent Labs  Lab 07/08/2019 0724 07/05/19 0258  WBC 4.1 5.3  HGB 12.0* 12.9*  HCT 37.0* 40.0  PLT 229 221  MCV 96.1 98.3  MCH 31.2 31.7  MCHC 32.4 32.3  RDW 14.5 14.2  LYMPHSABS 0.6* 0.7  MONOABS 0.3 0.4  EOSABS 0.0 0.0  BASOSABS 0.0 0.0    Chemistries  Recent Labs  Lab 07/19/2019 0724 07/05/19 0258  NA 139 140  K 4.1 4.1  CL 103 104  CO2 26 24  GLUCOSE 264* 165*  BUN 16 15  CREATININE 0.85 0.93  CALCIUM 7.9* 8.0*  AST 39 55*  ALT 26 32  ALKPHOS 40 40  BILITOT 0.7 1.6*   ------------------------------------------------------------------------------------------------------------------ No results for input(s): CHOL, HDL, LDLCALC, TRIG, CHOLHDL, LDLDIRECT in the last 72 hours.  Lab Results  Component Value Date   HGBA1C 8.0  (H) 07/22/2019   ------------------------------------------------------------------------------------------------------------------ No results for input(s): TSH, T4TOTAL, T3FREE, THYROIDAB in the last 72 hours.  Invalid input(s): FREET3 ------------------------------------------------------------------------------------------------------------------ No results for input(s): VITAMINB12, FOLATE, FERRITIN, TIBC, IRON, RETICCTPCT in the last 72 hours.  Coagulation profile No results for input(s): INR, PROTIME in the last 168 hours.  Recent Labs    07/05/2019 0724 07/05/19 0258  DDIMER 0.47 0.89*    Cardiac Enzymes No results for input(s): CKMB, TROPONINI, MYOGLOBIN in the last 168 hours.  Invalid input(s): CK ------------------------------------------------------------------------------------------------------------------ No results found for: BNP  Micro Results Recent Results (from the past 240 hour(s))  MRSA PCR Screening     Status: None   Collection Time: 07/29/2019  6:38 AM   Specimen: Nasal Mucosa; Nasopharyngeal  Result Value Ref Range Status   MRSA by PCR NEGATIVE NEGATIVE Final    Comment:        The GeneXpert MRSA Assay (FDA approved for NASAL specimens only), is one component of a comprehensive MRSA colonization surveillance program. It is not intended to diagnose MRSA infection nor to guide or monitor treatment for MRSA infections. Performed at [facility], [place] [address], [place], [place] [address]     Radiology Reports DG CHEST PORT 1 VIEW  Result Date: 07/05/2019 CLINICAL DATA:  Follow-up COVID-19 pneumonia. EXAM: PORTABLE CHEST 1 VIEW COMPARISON:  Chest x-ray 06/30/2019 and earlier. CTA chest 07/19/2019. FINDINGS: Cardiac silhouette upper normal in size for the AP portable technique and degree of inspiration. Patchy ground-glass airspace opacities throughout both lungs, unchanged since yesterday's CT, progressive since the 06/30/2019 chest  x-ray. No visible pleural effusions. IMPRESSION: Stable patchy ground-glass pneumonia throughout both lungs since yesterday's CT, progressive since a chest x-ray on 06/30/2019. Electronically Signed   By: Evangeline Dakin M.D.   On: 07/05/2019 08:38     Time Spent in minutes  30     Desiree Hane M.D on 07/05/2019 at 8:46 AM  To page go to [email or web address] - password [email or web address]

## 2019-07-06 ENCOUNTER — Inpatient Hospital Stay (HOSPITAL_COMMUNITY): Payer: Medicare HMO

## 2019-07-06 DIAGNOSIS — R0902 Hypoxemia: Secondary | ICD-10-CM

## 2019-07-06 DIAGNOSIS — F05 Delirium due to known physiological condition: Secondary | ICD-10-CM

## 2019-07-06 DIAGNOSIS — J1282 Pneumonia due to coronavirus disease 2019: Secondary | ICD-10-CM

## 2019-07-06 DIAGNOSIS — E119 Type 2 diabetes mellitus without complications: Secondary | ICD-10-CM

## 2019-07-06 LAB — CBC WITH DIFFERENTIAL/PLATELET
Abs Immature Granulocytes: 0.13 10*3/uL — ABNORMAL HIGH (ref 0.00–0.07)
Basophils Absolute: 0 10*3/uL (ref 0.0–0.1)
Basophils Relative: 1 %
Eosinophils Absolute: 0 10*3/uL (ref 0.0–0.5)
Eosinophils Relative: 0 %
HCT: 40.2 % (ref 39.0–52.0)
Hemoglobin: 12.7 g/dL — ABNORMAL LOW (ref 13.0–17.0)
Immature Granulocytes: 2 %
Lymphocytes Relative: 9 %
Lymphs Abs: 0.6 10*3/uL — ABNORMAL LOW (ref 0.7–4.0)
MCH: 30.2 pg (ref 26.0–34.0)
MCHC: 31.6 g/dL (ref 30.0–36.0)
MCV: 95.5 fL (ref 80.0–100.0)
Monocytes Absolute: 0.5 10*3/uL (ref 0.1–1.0)
Monocytes Relative: 8 %
Neutro Abs: 5 10*3/uL (ref 1.7–7.7)
Neutrophils Relative %: 80 %
Platelets: 237 10*3/uL (ref 150–400)
RBC: 4.21 MIL/uL — ABNORMAL LOW (ref 4.22–5.81)
RDW: 14.3 % (ref 11.5–15.5)
WBC: 6.2 10*3/uL (ref 4.0–10.5)
nRBC: 0.3 % — ABNORMAL HIGH (ref 0.0–0.2)

## 2019-07-06 LAB — COMPREHENSIVE METABOLIC PANEL
ALT: 39 U/L (ref 0–44)
AST: 40 U/L (ref 15–41)
Albumin: 3.1 g/dL — ABNORMAL LOW (ref 3.5–5.0)
Alkaline Phosphatase: 52 U/L (ref 38–126)
Anion gap: 18 — ABNORMAL HIGH (ref 5–15)
BUN: 31 mg/dL — ABNORMAL HIGH (ref 8–23)
CO2: 24 mmol/L (ref 22–32)
Calcium: 8.3 mg/dL — ABNORMAL LOW (ref 8.9–10.3)
Chloride: 100 mmol/L (ref 98–111)
Creatinine, Ser: 1.19 mg/dL (ref 0.61–1.24)
GFR calc Af Amer: >60 mL/min (ref >60)
GFR calc non Af Amer: 59 mL/min — ABNORMAL LOW (ref >60)
Glucose, Bld: 304 mg/dL — ABNORMAL HIGH (ref 70–99)
Potassium: 4 mmol/L (ref 3.5–5.1)
Sodium: 142 mmol/L (ref 135–145)
Total Bilirubin: 1.7 mg/dL — ABNORMAL HIGH (ref 0.3–1.2)
Total Protein: 6.5 g/dL (ref 6.5–8.1)

## 2019-07-06 LAB — PHOSPHORUS
Phosphorus: 2.9 mg/dL (ref 2.5–4.6)
Phosphorus: 3 mg/dL (ref 2.5–4.6)

## 2019-07-06 LAB — GLUCOSE, CAPILLARY
Glucose-Capillary: 217 mg/dL — ABNORMAL HIGH (ref 70–99)
Glucose-Capillary: 262 mg/dL — ABNORMAL HIGH (ref 70–99)
Glucose-Capillary: 286 mg/dL — ABNORMAL HIGH (ref 70–99)
Glucose-Capillary: 266 mg/dL — ABNORMAL HIGH (ref 70–99)
Glucose-Capillary: 260 mg/dL — ABNORMAL HIGH (ref 70–99)
Glucose-Capillary: 254 mg/dL — ABNORMAL HIGH (ref 70–99)

## 2019-07-06 LAB — D-DIMER, QUANTITATIVE: D-Dimer, Quant: 0.73 ug/mL-FEU — ABNORMAL HIGH (ref 0.00–0.50)

## 2019-07-06 LAB — MAGNESIUM
Magnesium: 2 mg/dL (ref 1.7–2.4)
Magnesium: 1.9 mg/dL (ref 1.7–2.4)

## 2019-07-06 LAB — C-REACTIVE PROTEIN: CRP: 4.7 mg/dL — ABNORMAL HIGH (ref <1.0)

## 2019-07-06 MED ORDER — PRO-STAT SUGAR FREE PO LIQD
30.0000 mL | Freq: Two times a day (BID) | ORAL | Status: DC
  Administered 2019-07-06 – 2019-07-07 (×3): 30 mL
  Filled 2019-07-06 (×3): qty 30

## 2019-07-06 MED ORDER — VITAL HIGH PROTEIN PO LIQD
1000.0000 mL | ORAL | Status: DC
  Administered 2019-07-06: 1000 mL

## 2019-07-06 MED ORDER — INSULIN GLARGINE 100 UNIT/ML ~~LOC~~ SOLN
10.0000 [IU] | Freq: Every day | SUBCUTANEOUS | Status: DC
  Administered 2019-07-06: 10 [IU] via SUBCUTANEOUS
  Filled 2019-07-06 (×2): qty 0.1

## 2019-07-06 NOTE — Progress Notes (Signed)
   NAME:  Bruce Weaver, MRN:  [id number], DOB:  1941/04/14, LOS: 2 ADMISSION DATE:  07/14/2019, CONSULTATION DATE:  07/05/19 REFERRING MD:  Lonny Prude, CHIEF COMPLAINT:  SOB   Brief History   79 year old man with metabolic syndrome presenting with severe COVID pneumonia.  History of present illness   79 year old man with metabolic syndrome presenting with severe COVID pneumonia.  Transferred from [place].  Diagnosed on 3/2.  Progressive hypoxemia and delirium prompting transfer to [facility].  In stepdown with higher O2 needs so PCCM consulted. Patient is AO to self only.  ROS as below with this caveat.  Past Medical History  DM2 HTN HLD Obesity  Significant Hospital Events   3/1 admitted to [facility] 3/2 diagnosed with COVID 3/5 4L to 15L, transferred to [facility] 3/6 PCCM consult  Consults:  PCCM  Procedures:  N/A  Significant Diagnostic Tests:  CXR- multifocal pneumonia CTA OSH neg for PE  Micro Data:  CRP low D dimer pretty low Pct neg  Antimicrobials:  Remdesivir   Interim history/subjective:  No acute events. On NRB with sats 93%. Remains altered, unable to swallow.  On 0.4 precedex  Objective   Blood pressure (!) 135/50, pulse 71, temperature 98.6 F (37 C), temperature source Oral, resp. rate 18, weight 120.7 kg, SpO2 (!) 89 %.    FiO2 (%):  [60 %-100 %] 90 %   Intake/Output Summary (Last 24 hours) at 07/06/2019 0930 Last data filed at 07/06/2019 0600 Gross per 24 hour  Intake 359.86 ml  Output 2100 ml  Net -1740.14 ml   Filed Weights   07/12/2019 0600  Weight: 120.7 kg    Examination: GEN: Adult male, altered but appears comfortable HEENT: MM dry, NRB in place CV: RRR, ext warm PULM: Clear, no accessory muscle use GI: Soft, +BS EXT: No edema NEURO: altered SKIN: No rashes   Assessment & Plan:  Acute hypoxemic respiratory failure due to COVID pneumonia - Remdesivir/steroids for usual course - Patient cannot prone nor participate well with IS due to  delirium - Inflammatory markers are not very elevated, agree with holding off on actemra - Hold further lasix, already almost -3L - Decision to intubate is based on clinical gestalt more than oxygen saturations for COVID patients.  Would try to avoid if at all possible in this man, he will not do well on vent.  Acute metabolic encephalopathy AKA delirium related to critical illness and steroids - Re orient as able, lights on during day, lights off at night - Mittens and wrist restraints to prevent him pulling off oxygen and endangering life - qHS ramelteon - Continue precedex as needed and wean to off as able  Best practice:  Diet: NPO until more awake. Pain/Anxiety/Delirium protocol (if indicated): See above VAP protocol (if indicated): N/A DVT prophylaxis: 0.5 mg/kg lovenox BID GI prophylaxis: would do pepcid while on AC and steroids Glucose control: per primary Mobility: BR Code Status: Full Family Communication: per primary Disposition:  ICU given tenuous respiratory status  CC time: 30 min.   Montey Hora, [facility] 07/06/2019, 9:33 AM

## 2019-07-06 NOTE — Progress Notes (Addendum)
RT attempted to wean patient off NRB w/ HHFNC per O2 sats of 96-97%, but O2 sats fell to low 80%. Patient O2 92% on HHFNC 70L/100% and NRB @ this time.

## 2019-07-06 NOTE — Progress Notes (Addendum)
[facility]  PROGRESS NOTE  KOHLTEN MANIERI [id number] DOB: [birth year] DOA: 07/05/2019 PCP: No primary care provider on file. Admit date - 07/26/2019   Admitting Physician Etta Quill, DO  Outpatient Primary MD for the patient is No primary care provider on file.  LOS - 2 Brief Narrative   Bruce Weaver is a 79 y.o. year old male with medical history significant for HTN, T2DM, HLD  who presented on 07/19/2019 as a transfer from [place]. He presented to [place] on 3/21 with days of generalized weakness and lightheadedness with standing since getting the firs series of COVID vaccine shots  On 06/25/19.   At the ED at [facility] notable for afebrile with BP 99/54 with positive orthostasis and normal oxygen saturation on rom air. Lab work significant for glucose of 65, WBC 2.9, and creatinine of 1.5 CXR with no acute abnormalities since getting the firs series of COVID vaccine shots  On 06/25/19  He was admitted with working diagnosis of hypoglycemia and orthostatic hypotension related to dehydration with AKI for which his home BP meds and oral hypoglycemics were held and given fluids. CT head and MRI brain obtained for weakness, tremors and confusion were unremarkable    Hospital course at [facility]  complicated worsening hypoxia requiring  2L and found to have multifocal pneumonia on CXR initially started on empiric ceftriaxone and azithromycin but given leukopenia and elevated CRP of 36.8 ( (26.8 on 3/5) and LDH of 717 COVID screen was obtained which was positive. He was started on IV decadron and Iv remdesivir on 3/2, Lovenox increased to full dose given significant d-dimer elevation (730 on 07/03/19).  Patient transferred to [facility] due to increasing O2 requirements (from 4 L to 15 L in 24 hours prior to transfer). CTA chest obtained on 3/6 prior to transfer negative for PE and consistent with bilateral ground glass opacities with superimposed consolidation.   Since being at [facility] patient  has been on 15 L NRB with good oxygen saturation. His course has been complicated by some increased restlessness/delirium and persistent need for O2 while monitoring in SDU.    Subjective  Today he is not talkative.ADDENDUM: started on precedex gtt overnight  A & P   Acute hypoxic respiratory failure secondary to COVID Pneumonia and mild ARDS, persists Currently on NRB at 65 % FiO2 to maintain sats greater than 92%.  No tachypnea, no work of breathing.  Inflammatory markers minimally elevated so far this admission.  Repeat CXR shows b/l opacities consistent with COVID PNa, PE ruled out on CTA at [facility]. Net neg 1.7 L with IV lasix --Remdesivir course completed, continue IV decadron -- Iv lasix 40 mg BID and close monitoring with BMP --Doesn't seem to meet criteria for Actemra based on inflammatory markers.  --Appreciate PCCM assistance given persistent O2 requirements -Encourageflutter valve and incentive spirometry as able, not able to prone safely due to confusion -Daily CRP, D-dimer - prophylactic Lovenox dosing given D-dimer is not elevated  Acute metabolic encephalopathy likely hospital acquired delirium in setting of infection and steroid use Today only opening eyes to voice.  -mittens and restraints to prevent removing oxygen -delirium precautions --started on ramelteon, looks stable now that he is on precedex  Type 2 diabetes, presented with hypoglycemic episodes now hyperglycemic secondary to IV steroids, A1c 8 (3/21) Fasting glucose in the 260s in setting of IV steroid use -Holding home oral hypoglycemics -Continue sliding scale for hyperglycemia given insulin naive want to try to avoid long acting  since he is not eating --  monitor CBGs  Hypertension Admitted with concern for orthostatic hypotension at [facility], BP meds resumed here and now at goal -home losartan -IV hydralazine as needed  AKI, resolved Creatinine slightly elevated on admission, now resolved -home  losartan --closely monitor while on lasix      Family Communication  :  Called daughter Luetta Nutting [phone] on 3/7, will update today  Code Status : Full code  Disposition Plan  :  Patient is from home. Anticipated d/c date: 3 to 4 days. Barriers to d/c or necessity for inpatient status:  Currently requiring high level of oxygen will need to wean to more appropriate regimen before able to go home, receiving Decadron and IV lasix, may still need further medications if O2 requirements remain high. Consults  : PCCM  Procedures  : None  DVT Prophylaxis  :  Lovenox-prophylactic dosing  Lab Results  Component Value Date   PLT 221 07/05/2019    Diet :  Diet Order            Diet full liquid Room service appropriate? Yes; Fluid consistency: Thin  Diet effective now               Inpatient Medications Scheduled Meds: . chlorhexidine  15 mL Mouth Rinse BID  . Chlorhexidine Gluconate Cloth  6 each Topical Daily  . dexamethasone (DECADRON) injection  6 mg Intravenous Q24H  . enoxaparin (LOVENOX) injection  0.5 mg/kg Subcutaneous Q12H  . furosemide  40 mg Intravenous Q12H  . insulin aspart  0-15 Units Subcutaneous Q4H  . losartan  50 mg Oral Daily  . mouth rinse  15 mL Mouth Rinse q12n4p  . pravastatin  40 mg Oral q1800  . ramelteon  8 mg Oral QHS   Continuous Infusions: . sodium chloride Stopped (07/09/2019 1107)  . dexmedetomidine (PRECEDEX) IV infusion Stopped (07/06/19 0417)  . famotidine (PEPCID) IV Stopped (07/05/19 1136)   PRN Meds:.sodium chloride, acetaminophen, chlorpheniramine-HYDROcodone, guaiFENesin-dextromethorphan, hydrALAZINE, ondansetron **OR** ondansetron (ZOFRAN) IV  Antibiotics  :   Anti-infectives (From admission, onward)   Start     Dose/Rate Route Frequency Ordered Stop   07/05/19 1000  remdesivir 100 mg in sodium chloride 0.9 % 100 mL IVPB  Status:  Discontinued     100 mg 200 mL/hr over 30 Minutes Intravenous Daily 07/01/2019 0620 07/24/2019 0637    07/21/2019 1000  remdesivir 100 mg in sodium chloride 0.9 % 100 mL IVPB     100 mg 200 mL/hr over 30 Minutes Intravenous  Once 07/15/2019 0637 07/21/2019 1053   07/16/2019 0630  remdesivir 100 mg in sodium chloride 0.9 % 100 mL IVPB  Status:  Discontinued     100 mg 200 mL/hr over 30 Minutes Intravenous Every 1 hr x 2 07/17/2019 0620 07/05/2019 0636       Objective   Vitals:   07/06/19 0500 07/06/19 0600 07/06/19 0736 07/06/19 0800  BP: (!) 126/49 (!) 162/68  (!) 135/50  Pulse: 60 77 81 71  Resp: 16 19 19 18   Temp:    98.6 F (37 C)  TempSrc:    Oral  SpO2: 91% 97% 94% (!) 89%  Weight:        SpO2: (!) 89 % O2 Flow Rate (L/min): (S) 65 L/min FiO2 (%): (S) 90 %  Wt Readings from Last 3 Encounters:  07/21/2019 120.7 kg     Intake/Output Summary (Last 24 hours) at 07/06/2019 [id number] Last data filed at 07/06/2019 0600 Gross per  24 hour  Intake 359.86 ml  Output 2100 ml  Net -1740.14 ml    Physical Exam:    Older male, resting comfortably in bed Awake, confused Moving all extremities without deficits [place].AT, Normal respiratory effort on 15 L NRB with SPO2 range 87-92,  no accessory muscle use +ve B.Sounds, Abd Soft, No tenderness, No rebound, guarding or rigidity. No Cyanosis, No new Rash or bruise     I have personally reviewed the following:   Data Reviewed:  CBC Recent Labs  Lab 07/20/2019 0724 07/05/19 0258  WBC 4.1 5.3  HGB 12.0* 12.9*  HCT 37.0* 40.0  PLT 229 221  MCV 96.1 98.3  MCH 31.2 31.7  MCHC 32.4 32.3  RDW 14.5 14.2  LYMPHSABS 0.6* 0.7  MONOABS 0.3 0.4  EOSABS 0.0 0.0  BASOSABS 0.0 0.0    Chemistries  Recent Labs  Lab 07/24/2019 0724 07/05/19 0258  NA 139 140  K 4.1 4.1  CL 103 104  CO2 26 24  GLUCOSE 264* 165*  BUN 16 15  CREATININE 0.85 0.93  CALCIUM 7.9* 8.0*  AST 39 55*  ALT 26 32  ALKPHOS 40 40  BILITOT 0.7 1.6*   ------------------------------------------------------------------------------------------------------------------ No  results for input(s): CHOL, HDL, LDLCALC, TRIG, CHOLHDL, LDLDIRECT in the last 72 hours.  Lab Results  Component Value Date   HGBA1C 8.0 (H) 06/30/2019   ------------------------------------------------------------------------------------------------------------------ No results for input(s): TSH, T4TOTAL, T3FREE, THYROIDAB in the last 72 hours.  Invalid input(s): FREET3 ------------------------------------------------------------------------------------------------------------------ No results for input(s): VITAMINB12, FOLATE, FERRITIN, TIBC, IRON, RETICCTPCT in the last 72 hours.  Coagulation profile No results for input(s): INR, PROTIME in the last 168 hours.  Recent Labs    07/11/2019 0724 07/05/19 0258  DDIMER 0.47 0.89*    Cardiac Enzymes No results for input(s): CKMB, TROPONINI, MYOGLOBIN in the last 168 hours.  Invalid input(s): CK ------------------------------------------------------------------------------------------------------------------ No results found for: BNP  Micro Results Recent Results (from the past 240 hour(s))  MRSA PCR Screening     Status: None   Collection Time: 07/06/2019  6:38 AM   Specimen: Nasal Mucosa; Nasopharyngeal  Result Value Ref Range Status   MRSA by PCR NEGATIVE NEGATIVE Final    Comment:        The GeneXpert MRSA Assay (FDA approved for NASAL specimens only), is one component of a comprehensive MRSA colonization surveillance program. It is not intended to diagnose MRSA infection nor to guide or monitor treatment for MRSA infections. Performed at [facility], [place] [address], [place], [place] [address]     Radiology Reports DG CHEST PORT 1 VIEW  Result Date: 07/05/2019 CLINICAL DATA:  Follow-up COVID-19 pneumonia. EXAM: PORTABLE CHEST 1 VIEW COMPARISON:  Chest x-ray 06/30/2019 and earlier. CTA chest 07/22/2019. FINDINGS: Cardiac silhouette upper normal in size for the AP portable technique and degree of  inspiration. Patchy ground-glass airspace opacities throughout both lungs, unchanged since yesterday's CT, progressive since the 06/30/2019 chest x-ray. No visible pleural effusions. IMPRESSION: Stable patchy ground-glass pneumonia throughout both lungs since yesterday's CT, progressive since a chest x-ray on 06/30/2019. Electronically Signed   By: Evangeline Dakin M.D.   On: 07/05/2019 08:38     Time Spent in minutes  30     Desiree Hane M.D on 07/06/2019 at 9:17 AM  To page go to [email or web address] - password [facility]

## 2019-07-07 ENCOUNTER — Other Ambulatory Visit: Payer: Self-pay

## 2019-07-07 ENCOUNTER — Encounter (HOSPITAL_COMMUNITY): Payer: Self-pay | Admitting: Internal Medicine

## 2019-07-07 LAB — CBC WITH DIFFERENTIAL/PLATELET
Abs Immature Granulocytes: 0.13 10*3/uL — ABNORMAL HIGH (ref 0.00–0.07)
Basophils Absolute: 0 10*3/uL (ref 0.0–0.1)
Basophils Relative: 0 %
Eosinophils Absolute: 0 10*3/uL (ref 0.0–0.5)
Eosinophils Relative: 0 %
HCT: 43.4 % (ref 39.0–52.0)
Hemoglobin: 14 g/dL (ref 13.0–17.0)
Immature Granulocytes: 2 %
Lymphocytes Relative: 8 %
Lymphs Abs: 0.6 10*3/uL — ABNORMAL LOW (ref 0.7–4.0)
MCH: 31 pg (ref 26.0–34.0)
MCHC: 32.3 g/dL (ref 30.0–36.0)
MCV: 96.2 fL (ref 80.0–100.0)
Monocytes Absolute: 0.4 10*3/uL (ref 0.1–1.0)
Monocytes Relative: 5 %
Neutro Abs: 6.2 10*3/uL (ref 1.7–7.7)
Neutrophils Relative %: 85 %
Platelets: 240 10*3/uL (ref 150–400)
RBC: 4.51 MIL/uL (ref 4.22–5.81)
RDW: 14.4 % (ref 11.5–15.5)
WBC: 7.3 10*3/uL (ref 4.0–10.5)
nRBC: 0.3 % — ABNORMAL HIGH (ref 0.0–0.2)

## 2019-07-07 LAB — BLOOD GAS, ARTERIAL
Acid-Base Excess: 8.5 mmol/L — ABNORMAL HIGH (ref 0.0–2.0)
Bicarbonate: 33.4 mmol/L — ABNORMAL HIGH (ref 20.0–28.0)
FIO2: 100
O2 Saturation: 85.8 %
Patient temperature: 98.2
pCO2 arterial: 47.5 mmHg (ref 32.0–48.0)
pH, Arterial: 7.46 — ABNORMAL HIGH (ref 7.350–7.450)
pO2, Arterial: 52.3 mmHg — ABNORMAL LOW (ref 83.0–108.0)

## 2019-07-07 LAB — D-DIMER, QUANTITATIVE: D-Dimer, Quant: 1.05 ug/mL-FEU — ABNORMAL HIGH (ref 0.00–0.50)

## 2019-07-07 LAB — COMPREHENSIVE METABOLIC PANEL
ALT: 35 U/L (ref 0–44)
AST: 25 U/L (ref 15–41)
Albumin: 3.2 g/dL — ABNORMAL LOW (ref 3.5–5.0)
Alkaline Phosphatase: 55 U/L (ref 38–126)
Anion gap: 12 (ref 5–15)
BUN: 40 mg/dL — ABNORMAL HIGH (ref 8–23)
CO2: 29 mmol/L (ref 22–32)
Calcium: 8.8 mg/dL — ABNORMAL LOW (ref 8.9–10.3)
Chloride: 101 mmol/L (ref 98–111)
Creatinine, Ser: 1.18 mg/dL (ref 0.61–1.24)
GFR calc Af Amer: >60 mL/min (ref >60)
GFR calc non Af Amer: 59 mL/min — ABNORMAL LOW (ref >60)
Glucose, Bld: 324 mg/dL — ABNORMAL HIGH (ref 70–99)
Potassium: 3.8 mmol/L (ref 3.5–5.1)
Sodium: 142 mmol/L (ref 135–145)
Total Bilirubin: 0.8 mg/dL (ref 0.3–1.2)
Total Protein: 6.8 g/dL (ref 6.5–8.1)

## 2019-07-07 LAB — GLUCOSE, CAPILLARY
Glucose-Capillary: 237 mg/dL — ABNORMAL HIGH (ref 70–99)
Glucose-Capillary: 304 mg/dL — ABNORMAL HIGH (ref 70–99)
Glucose-Capillary: 368 mg/dL — ABNORMAL HIGH (ref 70–99)
Glucose-Capillary: 375 mg/dL — ABNORMAL HIGH (ref 70–99)
Glucose-Capillary: 415 mg/dL — ABNORMAL HIGH (ref 70–99)
Glucose-Capillary: 386 mg/dL — ABNORMAL HIGH (ref 70–99)

## 2019-07-07 LAB — PHOSPHORUS
Phosphorus: 3.2 mg/dL (ref 2.5–4.6)
Phosphorus: 3.1 mg/dL (ref 2.5–4.6)

## 2019-07-07 LAB — MAGNESIUM
Magnesium: 2 mg/dL (ref 1.7–2.4)
Magnesium: 2.1 mg/dL (ref 1.7–2.4)

## 2019-07-07 LAB — C-REACTIVE PROTEIN: CRP: 4.4 mg/dL — ABNORMAL HIGH (ref <1.0)

## 2019-07-07 MED ORDER — GLUCERNA 1.5 CAL PO LIQD
1000.0000 mL | ORAL | Status: DC
  Administered 2019-07-07: 1000 mL
  Filled 2019-07-07 (×2): qty 1000

## 2019-07-07 MED ORDER — FREE WATER
100.0000 mL | Status: DC
  Administered 2019-07-07 – 2019-07-13 (×36): 100 mL

## 2019-07-07 MED ORDER — INSULIN ASPART 100 UNIT/ML ~~LOC~~ SOLN
6.0000 [IU] | SUBCUTANEOUS | Status: DC
  Administered 2019-07-07 – 2019-07-10 (×16): 6 [IU] via SUBCUTANEOUS

## 2019-07-07 MED ORDER — INSULIN GLARGINE 100 UNIT/ML ~~LOC~~ SOLN
15.0000 [IU] | Freq: Every day | SUBCUTANEOUS | Status: DC
  Administered 2019-07-07: 15 [IU] via SUBCUTANEOUS
  Filled 2019-07-07: qty 0.15

## 2019-07-07 MED ORDER — INSULIN DETEMIR 100 UNIT/ML ~~LOC~~ SOLN
15.0000 [IU] | Freq: Two times a day (BID) | SUBCUTANEOUS | Status: DC
  Administered 2019-07-07: 15 [IU] via SUBCUTANEOUS
  Filled 2019-07-07 (×2): qty 0.15

## 2019-07-07 MED ORDER — OSMOLITE 1.2 CAL PO LIQD: 1000.0000 mL | ORAL | Status: DC

## 2019-07-07 MED ORDER — PRO-STAT SUGAR FREE PO LIQD
30.0000 mL | Freq: Every day | ORAL | Status: DC
  Administered 2019-07-08: 30 mL
  Filled 2019-07-07: qty 30

## 2019-07-07 MED ORDER — FENTANYL CITRATE (PF) 100 MCG/2ML IJ SOLN
INTRAMUSCULAR | Status: AC
  Administered 2019-07-08: 50 ug via INTRAVENOUS
  Filled 2019-07-07: qty 4

## 2019-07-07 MED ORDER — INSULIN ASPART 100 UNIT/ML ~~LOC~~ SOLN
3.0000 [IU] | SUBCUTANEOUS | Status: DC
  Administered 2019-07-07 (×2): 3 [IU] via SUBCUTANEOUS

## 2019-07-07 NOTE — Progress Notes (Signed)
   NAME:  Bruce Weaver, MRN:  [id number], DOB:  08-17-1940, LOS: 3 ADMISSION DATE:  07/10/2019, CONSULTATION DATE:  07/05/19 REFERRING MD:  Lonny Prude, CHIEF COMPLAINT:  SOB   Brief History   79 year old man with metabolic syndrome presenting with severe COVID pneumonia.  History of present illness   79 year old man with metabolic syndrome presenting with severe COVID pneumonia.  Transferred from [place].  Diagnosed on 3/2.  Progressive hypoxemia and delirium prompting transfer to [facility].  In stepdown with higher O2 needs so PCCM consulted. Patient is AO to self only.  ROS as below with this caveat.  Past Medical History  DM2 HTN HLD Obesity  Significant Hospital Events   3/1 admitted to [facility] 3/2 diagnosed with COVID 3/5 4L to 15L, transferred to [facility] 3/6 PCCM consult  Consults:  PCCM  Procedures:  N/A  Significant Diagnostic Tests:  CXR- multifocal pneumonia CTA OSH neg for PE  Micro Data:  CRP low D dimer pretty low Pct neg  Antimicrobials:  Remdesivir   Interim history/subjective:  No acute events. On [place] with sats 93%. Delirium maybe slightly improved.  Objective   Blood pressure (!) 141/49, pulse 94, temperature 98.5 F (36.9 C), temperature source Axillary, resp. rate (!) 25, weight 120.9 kg, SpO2 90 %.    FiO2 (%):  [85 %-100 %] 90 %   Intake/Output Summary (Last 24 hours) at 07/07/2019 [id number] Last data filed at 07/07/2019 0600 Gross per 24 hour  Intake 688.07 ml  Output 1325 ml  Net -636.93 ml   Filed Weights   07/08/2019 0600 07/07/19 0426  Weight: 120.7 kg 120.9 kg    Examination: GEN: Adult male, in NAD HEENT: MM dry, HFNC in place CV: RRR, ext warm PULM: Clear, no accessory muscle use GI: Soft, +BS EXT: No edema NEURO: altered SKIN: No rashes   Assessment & Plan:  Acute hypoxemic respiratory failure due to COVID pneumonia.  S/p remdesivir. - Continue decadron - Patient cannot prone nor participate well with IS due to delirium -  Inflammatory markers are not very elevated, agree with holding off on actemra - Hold further lasix, already almost -3L  - Decision to intubate is based on clinical gestalt more than oxygen saturations for COVID patients.  Would try to avoid if at all possible in this man, he will not do well on vent.  Acute metabolic encephalopathy AKA delirium related to critical illness and steroids - Re orient as able, lights on during day, lights off at night - Mittens and wrist restraints to prevent him pulling off oxygen and endangering life - qHS ramelteon - Continue precedex as needed and wean to off as able.  Best practice:  Diet: NPO. Pain/Anxiety/Delirium protocol (if indicated): See above VAP protocol (if indicated): N/A DVT prophylaxis: 0.5 mg/kg lovenox BID GI prophylaxis: would do pepcid while on AC and steroids Glucose control: per primary Mobility: BR Code Status: Full Family Communication: per primary Disposition:  ICU given tenuous respiratory status  CC time: 30 min.   Montey Hora, [facility] 07/07/2019, 8:22 AM

## 2019-07-07 NOTE — Progress Notes (Signed)
[facility]  PROGRESS NOTE  Bruce Weaver [id number] DOB: January 21, 1941 DOA: 07/06/2019 PCP: Patient, [name] Admit date - 07/11/2019   Admitting Physician Etta Quill, DO  Outpatient Primary MD for the patient is Patient, [name]  LOS - 3 Brief Narrative   Bruce Weaver is a 79 y.o. year old male with medical history significant for HTN, T2DM, HLD  who presented on 07/17/2019 as a transfer from [place]. He presented to [place] on 3/21 with days of generalized weakness and lightheadedness with standing since getting the firs series of COVID vaccine shots  On 06/25/19.   At the ED at [facility] notable for afebrile with BP 99/54 with positive orthostasis and normal oxygen saturation on rom air. Lab work significant for glucose of 65, WBC 2.9, and creatinine of 1.5 CXR with no acute abnormalities since getting the firs series of COVID vaccine shots  On 06/25/19  He was admitted with working diagnosis of hypoglycemia and orthostatic hypotension related to dehydration with AKI for which his home BP meds and oral hypoglycemics were held and given fluids. CT head and MRI brain obtained for weakness, tremors and confusion were unremarkable    Hospital course at [place]  complicated worsening hypoxia requiring  2L and found to have multifocal pneumonia on CXR initially started on empiric ceftriaxone and azithromycin but given leukopenia and elevated CRP of 36.8 ( (26.8 on 3/5) and LDH of 717 COVID screen was obtained which was positive. He was started on IV decadron and Iv remdesivir on 3/2, Lovenox increased to full dose given significant d-dimer elevation (730 on 07/03/19).  Patient transferred to [facility] due to increasing O2 requirements (from 4 L to 15 L in 24 hours prior to transfer). CTA chest obtained on 3/6 prior to transfer negative for PE and consistent with bilateral ground glass opacities with superimposed consolidation.   Since being at [facility] patient has been on 15 L NRB with good  oxygen saturation. His course has been complicated by some increased restlessness/delirium and persistent need for O2 while monitoring in SDU.    Subjective  NGT placed and started on tube feeds. No acute events otherwise.   A & P   Acute hypoxic respiratory failure secondary to COVID Pneumonia and mild ARDS, persists Currently on HF [place] to maintain sats greater than 92%.  No tachypnea, no work of breathing.  Inflammatory markers minimally elevated so far this admission.  Repeat CXR shows b/l opacities consistent with COVID PNa, PE ruled out on CTA at [facility]. Net neg 1.7 L with IV lasix --Remdesivir course completed, continue IV decadron -- Iv lasix not needed given net negative, goal to keep net negative to dry --Doesn't seem to meet criteria for Actemra based on inflammatory markers.  --Appreciate PCCM assistance given persistent O2 requirements -Encourage flutter valve and incentive spirometry as able, not able to prone safely due to confusion -Daily CRP, D-dimer - prophylactic Lovenox dosing given D-dimer is not elevated  Acute metabolic encephalopathy likely hospital acquired delirium in setting of infection and steroid use Today only opening eyes to voice. Looks better on precedex gtt  -mittens and restraints to prevent removing oxygen -delirium precautions --started on ramelteon, and precedex per PCCM --NGT and with tube feeds  Type 2 diabetes, presented with hypoglycemic episodes now hyperglycemic secondary to IV steroids, A1c 8 (3/21) Fasting glucose in the 200s in setting of IV steroid use -Holding home oral hypoglycemics -changed to levemir 10 U BID, novolog 3 U  q 4 H given tube feeds,  CBGs q 4H  Hypertension Admitted with concern for orthostatic hypotension at [facility], BP meds resumed here and now at goal -home losartan -IV hydralazine as needed  AKI, resolved Creatinine slightly elevated on admission, now resolved -home losartan --closely monitor while on  lasix      Family Communication  :  Called daughter Luetta Nutting [phone] on 3/7, will update on 3/9  Code Status : Full code  Disposition Plan  :  Patient is from home. Anticipated d/c date: 3 to 4 days. Barriers to d/c or necessity for inpatient status:  Currently requiring high level of oxygen will need to wean to more appropriate regimen before able to go home, receiving Decadron andx, may still need further medications if O2 requirements remain high.Additionally need to be able to wean precedex off for delirium Consults  : PCCM  Procedures  : None  DVT Prophylaxis  :  Lovenox-prophylactic dosing  Lab Results  Component Value Date   PLT 240 07/07/2019    Diet :  Diet Order            Diet full liquid Room service appropriate? Yes; Fluid consistency: Thin  Diet effective now               Inpatient Medications Scheduled Meds: . chlorhexidine  15 mL Mouth Rinse BID  . Chlorhexidine Gluconate Cloth  6 each Topical Daily  . dexamethasone (DECADRON) injection  6 mg Intravenous Q24H  . enoxaparin (LOVENOX) injection  0.5 mg/kg Subcutaneous Q12H  . [START ON 07/08/2019] feeding supplement (PRO-STAT SUGAR FREE 64)  30 mL Per Tube Daily  . feeding supplement (VITAL HIGH PROTEIN)  1,000 mL Per Tube Q24H  . free water  100 mL Per Tube Q4H  . insulin aspart  0-15 Units Subcutaneous Q4H  . insulin glargine  15 Units Subcutaneous Daily  . losartan  50 mg Oral Daily  . mouth rinse  15 mL Mouth Rinse q12n4p  . pravastatin  40 mg Oral q1800  . ramelteon  8 mg Oral QHS   Continuous Infusions: . sodium chloride Stopped (07/05/2019 1107)  . dexmedetomidine (PRECEDEX) IV infusion 0.2 mcg/kg/hr (07/07/19 0934)  . famotidine (PEPCID) IV Stopped (07/07/19 1037)  . feeding supplement (GLUCERNA 1.5 CAL)     PRN Meds:.sodium chloride, acetaminophen, chlorpheniramine-HYDROcodone, guaiFENesin-dextromethorphan, hydrALAZINE, ondansetron **OR** ondansetron (ZOFRAN) IV  Antibiotics  :    Anti-infectives (From admission, onward)   Start     Dose/Rate Route Frequency Ordered Stop   07/05/19 1000  remdesivir 100 mg in sodium chloride 0.9 % 100 mL IVPB  Status:  Discontinued     100 mg 200 mL/hr over 30 Minutes Intravenous Daily 07/10/2019 0620 07/05/2019 0637   07/22/2019 1000  remdesivir 100 mg in sodium chloride 0.9 % 100 mL IVPB     100 mg 200 mL/hr over 30 Minutes Intravenous  Once 07/11/2019 0637 07/24/2019 1053   07/24/2019 0630  remdesivir 100 mg in sodium chloride 0.9 % 100 mL IVPB  Status:  Discontinued     100 mg 200 mL/hr over 30 Minutes Intravenous Every 1 hr x 2 07/11/2019 0620 07/21/2019 0636       Objective   Vitals:   07/07/19 0900 07/07/19 1000 07/07/19 1100 07/07/19 1104  BP:  (!) 179/72    Pulse: 74 63 61 69  Resp: 14 14 14 16   Temp:      TempSrc:      SpO2: 91% 94% 99% 98%  Weight:        SpO2: 98 % O2 Flow Rate (L/min): 70 L/min FiO2 (%): 85 %  Wt Readings from Last 3 Encounters:  07/07/19 120.9 kg     Intake/Output Summary (Last 24 hours) at 07/07/2019 1155 Last data filed at 07/07/2019 0600 Gross per 24 hour  Intake 688.07 ml  Output 1325 ml  Net -636.93 ml    Physical Exam:    Older male, resting comfortably in bed Sleeping comfortably, opens eyes to voice Moving all extremities without deficits [place].AT, Normal respiratory effort on 15 L HF [place] with SPO2 range 87-92,  no accessory muscle use +ve B.Sounds, Abd Soft, No tenderness, No rebound, guarding or rigidity. No Cyanosis, No new Rash or bruise     I have personally reviewed the following:   Data Reviewed:  CBC Recent Labs  Lab 07/13/2019 0724 07/05/19 0258 07/06/19 0955 07/07/19 0555  WBC 4.1 5.3 6.2 7.3  HGB 12.0* 12.9* 12.7* 14.0  HCT 37.0* 40.0 40.2 43.4  PLT 229 221 237 240  MCV 96.1 98.3 95.5 96.2  MCH 31.2 31.7 30.2 31.0  MCHC 32.4 32.3 31.6 32.3  RDW 14.5 14.2 14.3 14.4  LYMPHSABS 0.6* 0.7 0.6* 0.6*  MONOABS 0.3 0.4 0.5 0.4  EOSABS 0.0 0.0 0.0 0.0  BASOSABS 0.0  0.0 0.0 0.0    Chemistries  Recent Labs  Lab 07/07/2019 0724 07/05/19 0258 07/06/19 0955 07/06/19 1303 07/06/19 1714 07/07/19 0555  NA 139 140 142  --   --  142  K 4.1 4.1 4.0  --   --  3.8  CL 103 104 100  --   --  101  CO2 26 24 24   --   --  29  GLUCOSE 264* 165* 304*  --   --  324*  BUN 16 15 31*  --   --  40*  CREATININE 0.85 0.93 1.19  --   --  1.18  CALCIUM 7.9* 8.0* 8.3*  --   --  8.8*  MG  --   --   --  2.0 1.9 2.0  AST 39 55* 40  --   --  25  ALT 26 32 39  --   --  35  ALKPHOS 40 40 52  --   --  55  BILITOT 0.7 1.6* 1.7*  --   --  0.8   ------------------------------------------------------------------------------------------------------------------ No results for input(s): CHOL, HDL, LDLCALC, TRIG, CHOLHDL, LDLDIRECT in the last 72 hours.  Lab Results  Component Value Date   HGBA1C 8.0 (H) 07/20/2019   ------------------------------------------------------------------------------------------------------------------ No results for input(s): TSH, T4TOTAL, T3FREE, THYROIDAB in the last 72 hours.  Invalid input(s): FREET3 ------------------------------------------------------------------------------------------------------------------ No results for input(s): VITAMINB12, FOLATE, FERRITIN, TIBC, IRON, RETICCTPCT in the last 72 hours.  Coagulation profile No results for input(s): INR, PROTIME in the last 168 hours.  Recent Labs    07/06/19 0955 07/07/19 0555  DDIMER 0.73* 1.05*    Cardiac Enzymes No results for input(s): CKMB, TROPONINI, MYOGLOBIN in the last 168 hours.  Invalid input(s): CK ------------------------------------------------------------------------------------------------------------------ No results found for: BNP  Micro Results Recent Results (from the past 240 hour(s))  MRSA PCR Screening     Status: None   Collection Time: 07/18/2019  6:38 AM   Specimen: Nasal Mucosa; Nasopharyngeal  Result Value Ref Range Status   MRSA by PCR NEGATIVE  NEGATIVE Final    Comment:        The GeneXpert MRSA Assay (FDA approved for NASAL specimens only), is  one component of a comprehensive MRSA colonization surveillance program. It is not intended to diagnose MRSA infection nor to guide or monitor treatment for MRSA infections. Performed at [facility], [place] [address] [place], [place] [address]     Radiology Reports DG Abd 1 View  Result Date: 07/06/2019 CLINICAL DATA:  79 y.o male. Pt pulled last NG tube out. Encounter for placement. EXAM: ABDOMEN - 1 VIEW COMPARISON:  Abdominal radiograph 07/06/2019 FINDINGS: The enteric tube tip projects appropriately over the stomach. Nonobstructive bowel gas pattern in visualized portion of the abdomen. IMPRESSION: Enteric tube tip projects within the stomach. Electronically Signed   By: Audie Pinto M.D.   On: 07/06/2019 16:52   DG Abd 1 View  Result Date: 07/06/2019 CLINICAL DATA:  NG tube placement EXAM: ABDOMEN - 1 VIEW COMPARISON:  None. FINDINGS: Enteric tube terminates within the stomach. Bowel gas pattern is unremarkable. IMPRESSION: Enteric tube is within the stomach. Electronically Signed   By: Macy Mis M.D.   On: 07/06/2019 14:07   DG CHEST PORT 1 VIEW  Result Date: 07/05/2019 CLINICAL DATA:  Follow-up COVID-19 pneumonia. EXAM: PORTABLE CHEST 1 VIEW COMPARISON:  Chest x-ray 06/30/2019 and earlier. CTA chest 07/26/2019. FINDINGS: Cardiac silhouette upper normal in size for the AP portable technique and degree of inspiration. Patchy ground-glass airspace opacities throughout both lungs, unchanged since yesterday's CT, progressive since the 06/30/2019 chest x-ray. No visible pleural effusions. IMPRESSION: Stable patchy ground-glass pneumonia throughout both lungs since yesterday's CT, progressive since a chest x-ray on 06/30/2019. Electronically Signed   By: Evangeline Dakin M.D.   On: 07/05/2019 08:38     Time Spent in minutes  30     Desiree Hane M.D on  07/07/2019 at 11:55 AM  To page go to [email or web address] - password [facility]

## 2019-07-07 NOTE — Progress Notes (Signed)
Inpatient Diabetes Program Recommendations  AACE/ADA: New Consensus Statement on Inpatient Glycemic Control (2015)  Target Ranges:  Prepandial:   less than 140 mg/dL      Peak postprandial:   less than 180 mg/dL (1-2 hours)      Critically ill patients:  140 - 180 mg/dL   Lab Results  Component Value Date   GLUCAP 304 (H) 07/07/2019   HGBA1C 8.0 (H) 07/01/2019    Review of Glycemic Control Results for Bruce Weaver, Bruce Weaver (MRN [id number]) as of 07/07/2019 10:14  Ref. Range 07/06/2019 16:12 07/06/2019 19:45 07/06/2019 23:13 07/07/2019 03:16 07/07/2019 08:19  Glucose-Capillary Latest Ref Range: 70 - 99 mg/dL 266 (H) 260 (H) 254 (H) 237 (H) 304 (H)   Diabetes history: DM 2 Outpatient Diabetes medications:  Metformin 1000 mg bid, Actos 45 mg daily Januvia 100 mg daily Current orders for Inpatient glycemic control:  Lantus 15 units daily, Novolog moderate q 4 hours Decadron 6 mg daily Inpatient Diabetes Program Recommendations:    May consider d/c of Lantus and start Levemir 10 units bid.  Also please add Novolog tube feed coverage 3 units q 4 hours.   Thanks  Adah Perl, RN, BC-ADM Inpatient Diabetes Coordinator Pager [phone] (8a-5p)

## 2019-07-07 NOTE — Progress Notes (Signed)
Initial Nutrition Assessment  RD working remotely.   DOCUMENTATION CODES:   Not applicable  INTERVENTION:  - will adjust TF regimen to better meet needs: Glucerna 1.5 @ 50 ml/hr with 30 ml prostat once/day and 100 ml free water every 4 hours. - this regimen will provide 1900 kcal (95% estimated kcal need), 170 grams carb, 114 grams protein, and 1511 ml free water.    NUTRITION DIAGNOSIS:   Increased nutrient needs related to acute illness(COVID-19 infection) as evidenced by estimated needs.  GOAL:   Patient will meet greater than or equal to 90% of their needs  MONITOR:   PO intake, TF tolerance, Labs, Weight trends  REASON FOR ASSESSMENT:   Consult Enteral/tube feeding initiation and management  ASSESSMENT:   79 y.o. male with medical history of type 2 DM, HTN, and HLD. Patient received the first dose of COVID vaccine on 2/25. He presented to the ED with generalized weakness and fatigue which have been worsening. He was admitted to [facility] on 3/1 and at that time was found to have multifocal PNA and he tested positive for COVID-19 on 3/2 and was started on remdesivir and steroids. CT chest negative for PE, indicated COVID-19 PNA. He was transferred from [facility] to [place] due to high oxygen requirement.  Patient is on FLD with no intakes documented since admission to [facility] on 3/6. Flow sheet documentation indicates patient is a/o to self and place. Able to talk with RN who indicates patient has not had anything PO today. She denies any issues with TF, no abdominal distention.   NGT placed in R nare yesterday at 1600 and he is currently receiving Vital High Protein @ 40 ml/hr with 30 ml prostat BID. This regimen is providing 1160 kcal, 114 grams protein, and 802 ml free water.   Per chart review, weight today is 266 lb and no PTA wt information is available.   Per notes: - acute hypoxemic respiratory failure d/t COVID PNA--s/p remdesivir - acute  metabolic encephalopathy, delirium   Labs reviewed; CBGs: 237 and 304 mg/dl, BUN: 40 mg/dl. Medications reviewed; 20 mg IV pepcid/day, sliding scale novolog, 15 units lantus/day.    NUTRITION - FOCUSED PHYSICAL EXAM:  unable to complete for COVID+ patient.   Diet Order:   Diet Order            Diet full liquid Room service appropriate? Yes; Fluid consistency: Thin  Diet effective now              EDUCATION NEEDS:   No education needs have been identified at this time  Skin:  Skin Assessment: Reviewed RN Assessment  Last BM:  3/8  Height:   Ht Readings from Last 1 Encounters:  No data found for Ht    Weight:   Wt Readings from Last 1 Encounters:  07/07/19 120.9 kg    Estimated Nutritional Needs:  Kcal:  2000-2200 kcal Protein:  100-110 grams Fluid:  >/= 2 L/day      Jarome Matin, MS, RD, LDN, CNSC Inpatient Clinical Dietitian RD pager # available in [place]  After hours/weekend pager # available in [facility]

## 2019-07-07 NOTE — Progress Notes (Signed)
RT called to see patietn for low O2 sats. Upon arrival, O2 75% on 55L and 60% HHFNC. Flow increased to 70L and 100% to maintain sats >/= 88%; NRB needed at this time as well. O2 sats increasing with both devices. Patient VS otherwise stable and patient resting.   1815: O2 sats 95% and NRB removed. RT will attempt to continue to wean HHFNC as tolerated by patient; O2 remaining 92-93% at this time.

## 2019-07-07 NOTE — Progress Notes (Signed)
Pharmacy Brief Note -   Pharmacy consulted to dose enoxaparin for VTE ppx in COVID + patient.   Pt transferred from [facility] where he was on therapeutic LMWH. D-dimer reportedly peaked there at 1.02. CTA negative for PE. LD LMWH PTA at [facility] was 3/5 @ 2040. Discussed with MD - no therapeutic AC indicated at this time.  Lovenox 60mg  SQ bid TBW 120.9 kg D-dimer 1.05 CBC wnl  Continue enoxaparin 0.5 mg/kg subQ BID for DVT ppx.  Minda Ditto, PharmD 07/07/19 11:21 AM

## 2019-07-07 NOTE — Plan of Care (Signed)
  Problem: Nutrition: Goal: Adequate nutrition will be maintained Outcome: Progressing   Problem: Coping: Goal: Level of anxiety will decrease Outcome: Progressing   

## 2019-07-07 NOTE — Progress Notes (Signed)
[place] exchanged to new set per [facility] heater malfunctioning. No complications were noted with patient; VS stable. Patient was placed on NRB and maintained O2 sats fairly well (dropped from 96% to 89%). Patient placed back on new HHFNC setup at 70% and 85%; O2 sats currently 94-95%.

## 2019-07-08 ENCOUNTER — Inpatient Hospital Stay (HOSPITAL_COMMUNITY): Payer: Medicare HMO

## 2019-07-08 LAB — BLOOD GAS, ARTERIAL
Acid-Base Excess: 8.4 mmol/L — ABNORMAL HIGH (ref 0.0–2.0)
Acid-Base Excess: 7 mmol/L — ABNORMAL HIGH (ref 0.0–2.0)
Acid-Base Excess: 5.6 mmol/L — ABNORMAL HIGH (ref 0.0–2.0)
Bicarbonate: 33.4 mmol/L — ABNORMAL HIGH (ref 20.0–28.0)
Bicarbonate: 32.3 mmol/L — ABNORMAL HIGH (ref 20.0–28.0)
Bicarbonate: 31.4 mmol/L — ABNORMAL HIGH (ref 20.0–28.0)
Drawn by: 331471
FIO2: 100
FIO2: 100
MECHVT: 620 mL
O2 Saturation: 93.4 %
O2 Saturation: 98.3 %
O2 Saturation: 94.8 %
Patient temperature: 97.6
Patient temperature: 98.6
Patient temperature: 98.6
RATE: 18 resp/min
pCO2 arterial: 47.2 mmHg (ref 32.0–48.0)
pCO2 arterial: 50.1 mmHg — ABNORMAL HIGH (ref 32.0–48.0)
pCO2 arterial: 51.5 mmHg — ABNORMAL HIGH (ref 32.0–48.0)
pH, Arterial: 7.461 — ABNORMAL HIGH (ref 7.350–7.450)
pH, Arterial: 7.425 (ref 7.350–7.450)
pH, Arterial: 7.402 (ref 7.350–7.450)
pO2, Arterial: 68 mmHg — ABNORMAL LOW (ref 83.0–108.0)
pO2, Arterial: 151 mmHg — ABNORMAL HIGH (ref 83.0–108.0)
pO2, Arterial: 85.2 mmHg (ref 83.0–108.0)

## 2019-07-08 LAB — CBC WITH DIFFERENTIAL/PLATELET
Abs Immature Granulocytes: 0.15 10*3/uL — ABNORMAL HIGH (ref 0.00–0.07)
Basophils Absolute: 0 10*3/uL (ref 0.0–0.1)
Basophils Relative: 1 %
Eosinophils Absolute: 0 10*3/uL (ref 0.0–0.5)
Eosinophils Relative: 0 %
HCT: 43.9 % (ref 39.0–52.0)
Hemoglobin: 13.7 g/dL (ref 13.0–17.0)
Immature Granulocytes: 2 %
Lymphocytes Relative: 7 %
Lymphs Abs: 0.5 10*3/uL — ABNORMAL LOW (ref 0.7–4.0)
MCH: 30.6 pg (ref 26.0–34.0)
MCHC: 31.2 g/dL (ref 30.0–36.0)
MCV: 98 fL (ref 80.0–100.0)
Monocytes Absolute: 0.3 10*3/uL (ref 0.1–1.0)
Monocytes Relative: 5 %
Neutro Abs: 5.7 10*3/uL (ref 1.7–7.7)
Neutrophils Relative %: 85 %
Platelets: 261 10*3/uL (ref 150–400)
RBC: 4.48 MIL/uL (ref 4.22–5.81)
RDW: 14.4 % (ref 11.5–15.5)
WBC: 6.7 10*3/uL (ref 4.0–10.5)
nRBC: 0 % (ref 0.0–0.2)

## 2019-07-08 LAB — BASIC METABOLIC PANEL
Anion gap: 13 (ref 5–15)
BUN: 62 mg/dL — ABNORMAL HIGH (ref 8–23)
CO2: 31 mmol/L (ref 22–32)
Calcium: 8.4 mg/dL — ABNORMAL LOW (ref 8.9–10.3)
Chloride: 103 mmol/L (ref 98–111)
Creatinine, Ser: 1.83 mg/dL — ABNORMAL HIGH (ref 0.61–1.24)
GFR calc Af Amer: 40 mL/min — ABNORMAL LOW (ref >60)
GFR calc non Af Amer: 35 mL/min — ABNORMAL LOW (ref >60)
Glucose, Bld: 265 mg/dL — ABNORMAL HIGH (ref 70–99)
Potassium: 3.6 mmol/L (ref 3.5–5.1)
Sodium: 147 mmol/L — ABNORMAL HIGH (ref 135–145)

## 2019-07-08 LAB — TROPONIN I (HIGH SENSITIVITY)
Troponin I (High Sensitivity): 12 ng/L (ref <18)
Troponin I (High Sensitivity): 17 ng/L (ref <18)

## 2019-07-08 LAB — GLUCOSE, CAPILLARY
Glucose-Capillary: 360 mg/dL — ABNORMAL HIGH (ref 70–99)
Glucose-Capillary: 359 mg/dL — ABNORMAL HIGH (ref 70–99)
Glucose-Capillary: 372 mg/dL — ABNORMAL HIGH (ref 70–99)
Glucose-Capillary: 254 mg/dL — ABNORMAL HIGH (ref 70–99)
Glucose-Capillary: 223 mg/dL — ABNORMAL HIGH (ref 70–99)

## 2019-07-08 LAB — COMPREHENSIVE METABOLIC PANEL
ALT: 28 U/L (ref 0–44)
AST: 20 U/L (ref 15–41)
Albumin: 2.8 g/dL — ABNORMAL LOW (ref 3.5–5.0)
Alkaline Phosphatase: 63 U/L (ref 38–126)
Anion gap: 12 (ref 5–15)
BUN: 45 mg/dL — ABNORMAL HIGH (ref 8–23)
CO2: 29 mmol/L (ref 22–32)
Calcium: 8.5 mg/dL — ABNORMAL LOW (ref 8.9–10.3)
Chloride: 105 mmol/L (ref 98–111)
Creatinine, Ser: 1.04 mg/dL (ref 0.61–1.24)
GFR calc Af Amer: >60 mL/min (ref >60)
GFR calc non Af Amer: >60 mL/min (ref >60)
Glucose, Bld: 389 mg/dL — ABNORMAL HIGH (ref 70–99)
Potassium: 3.8 mmol/L (ref 3.5–5.1)
Sodium: 146 mmol/L — ABNORMAL HIGH (ref 135–145)
Total Bilirubin: 0.8 mg/dL (ref 0.3–1.2)
Total Protein: 6.4 g/dL — ABNORMAL LOW (ref 6.5–8.1)

## 2019-07-08 LAB — D-DIMER, QUANTITATIVE: D-Dimer, Quant: 0.67 ug/mL-FEU — ABNORMAL HIGH (ref 0.00–0.50)

## 2019-07-08 LAB — TRIGLYCERIDES: Triglycerides: 138 mg/dL (ref <150)

## 2019-07-08 LAB — C-REACTIVE PROTEIN: CRP: 3.3 mg/dL — ABNORMAL HIGH (ref <1.0)

## 2019-07-08 LAB — PHOSPHORUS: Phosphorus: 4.4 mg/dL (ref 2.5–4.6)

## 2019-07-08 LAB — MAGNESIUM: Magnesium: 2.4 mg/dL (ref 1.7–2.4)

## 2019-07-08 MED ORDER — VECURONIUM BOLUS VIA INFUSION
5.0000 mg | Freq: Once | INTRAVENOUS | Status: AC
  Administered 2019-07-08: 5 mg via INTRAVENOUS
  Filled 2019-07-08: qty 5

## 2019-07-08 MED ORDER — PHENYLEPHRINE HCL-NACL 10-0.9 MG/250ML-% IV SOLN
INTRAVENOUS | Status: AC
  Filled 2019-07-08: qty 250

## 2019-07-08 MED ORDER — SODIUM CHLORIDE 0.9% FLUSH
10.0000 mL | Freq: Two times a day (BID) | INTRAVENOUS | Status: DC
  Administered 2019-07-08: 30 mL
  Administered 2019-07-09 – 2019-07-13 (×9): 10 mL
  Administered 2019-07-13: 30 mL
  Administered 2019-07-14 – 2019-07-23 (×15): 10 mL

## 2019-07-08 MED ORDER — SODIUM CHLORIDE 0.9 % IV SOLN: INTRAVENOUS | Status: DC | PRN

## 2019-07-08 MED ORDER — PROPOFOL 1000 MG/100ML IV EMUL
5.0000 ug/kg/min | INTRAVENOUS | Status: DC
  Administered 2019-07-08: 30 ug/kg/min via INTRAVENOUS
  Administered 2019-07-08: 5 ug/kg/min via INTRAVENOUS
  Filled 2019-07-08: qty 100

## 2019-07-08 MED ORDER — PROPOFOL 1000 MG/100ML IV EMUL
INTRAVENOUS | Status: AC
  Filled 2019-07-08: qty 100

## 2019-07-08 MED ORDER — PRO-STAT SUGAR FREE PO LIQD: 30.0000 mL | Freq: Three times a day (TID) | ORAL | Status: DC

## 2019-07-08 MED ORDER — PROPOFOL 1000 MG/100ML IV EMUL
25.0000 ug/kg/min | INTRAVENOUS | Status: DC
  Administered 2019-07-08: 22:00:00 55 ug/kg/min via INTRAVENOUS
  Administered 2019-07-08: 65 ug/kg/min via INTRAVENOUS
  Administered 2019-07-08: 25 ug/kg/min via INTRAVENOUS
  Administered 2019-07-08: 20:00:00 60 ug/kg/min via INTRAVENOUS
  Administered 2019-07-08: 65 ug/kg/min via INTRAVENOUS
  Administered 2019-07-09: 25 ug/kg/min via INTRAVENOUS
  Administered 2019-07-09: 11:00:00 40 ug/kg/min via INTRAVENOUS
  Administered 2019-07-09 (×3): 50 ug/kg/min via INTRAVENOUS
  Administered 2019-07-09: 17:00:00 25 ug/kg/min via INTRAVENOUS
  Administered 2019-07-10: 18:00:00 10 ug/kg/min via INTRAVENOUS
  Administered 2019-07-10 (×2): 20 ug/kg/min via INTRAVENOUS
  Administered 2019-07-10 – 2019-07-11 (×2): 25 ug/kg/min via INTRAVENOUS
  Administered 2019-07-14: 40 ug/kg/min via INTRAVENOUS
  Administered 2019-07-14 (×2): 25 ug/kg/min via INTRAVENOUS
  Administered 2019-07-15: 50 ug/kg/min via INTRAVENOUS
  Administered 2019-07-15: 40 ug/kg/min via INTRAVENOUS
  Administered 2019-07-15 (×2): 50 ug/kg/min via INTRAVENOUS
  Administered 2019-07-15 (×2): 40 ug/kg/min via INTRAVENOUS
  Administered 2019-07-15: 55 ug/kg/min via INTRAVENOUS
  Administered 2019-07-15: 50 ug/kg/min via INTRAVENOUS
  Administered 2019-07-16 (×3): 55 ug/kg/min via INTRAVENOUS
  Administered 2019-07-16: 50 ug/kg/min via INTRAVENOUS
  Administered 2019-07-16: 29.42 ug/kg/min via INTRAVENOUS
  Administered 2019-07-16: 50 ug/kg/min via INTRAVENOUS
  Administered 2019-07-16 – 2019-07-17 (×6): 55 ug/kg/min via INTRAVENOUS
  Filled 2019-07-08 (×19): qty 100
  Filled 2019-07-08: qty 200
  Filled 2019-07-08: qty 100
  Filled 2019-07-08: qty 200
  Filled 2019-07-08 (×10): qty 100
  Filled 2019-07-08: qty 200
  Filled 2019-07-08 (×2): qty 100

## 2019-07-08 MED ORDER — VECURONIUM BROMIDE 10 MG IV SOLR
0.0000 ug/kg/min | INTRAVENOUS | Status: DC
  Administered 2019-07-08: 16:00:00 1 ug/kg/min via INTRAVENOUS
  Administered 2019-07-09: 0.4 ug/kg/min via INTRAVENOUS
  Filled 2019-07-08 (×3): qty 100

## 2019-07-08 MED ORDER — SODIUM CHLORIDE 0.9 % IV SOLN: 250.0000 mL | INTRAVENOUS | Status: DC

## 2019-07-08 MED ORDER — CHLORHEXIDINE GLUCONATE 0.12% ORAL RINSE (MEDLINE KIT)
15.0000 mL | Freq: Two times a day (BID) | OROMUCOSAL | Status: DC
  Administered 2019-07-08 – 2019-07-23 (×31): 15 mL via OROMUCOSAL

## 2019-07-08 MED ORDER — INSULIN DETEMIR 100 UNIT/ML ~~LOC~~ SOLN
18.0000 [IU] | Freq: Two times a day (BID) | SUBCUTANEOUS | Status: DC
  Administered 2019-07-08 – 2019-07-09 (×3): 18 [IU] via SUBCUTANEOUS
  Filled 2019-07-08 (×4): qty 0.18

## 2019-07-08 MED ORDER — INSULIN ASPART 100 UNIT/ML ~~LOC~~ SOLN
0.0000 [IU] | SUBCUTANEOUS | Status: DC
  Administered 2019-07-08: 8 [IU] via SUBCUTANEOUS
  Administered 2019-07-08: 20:00:00 5 [IU] via SUBCUTANEOUS
  Administered 2019-07-08: 8 [IU] via SUBCUTANEOUS
  Administered 2019-07-08: 15 [IU] via SUBCUTANEOUS
  Administered 2019-07-09: 3 [IU] via SUBCUTANEOUS
  Administered 2019-07-09: 5 [IU] via SUBCUTANEOUS
  Administered 2019-07-09: 3 [IU] via SUBCUTANEOUS
  Administered 2019-07-09 (×2): 8 [IU] via SUBCUTANEOUS
  Administered 2019-07-10: 08:00:00 3 [IU] via SUBCUTANEOUS
  Administered 2019-07-10 (×3): 5 [IU] via SUBCUTANEOUS
  Administered 2019-07-10 – 2019-07-11 (×2): 8 [IU] via SUBCUTANEOUS
  Administered 2019-07-11: 3 [IU] via SUBCUTANEOUS
  Administered 2019-07-11 (×2): 5 [IU] via SUBCUTANEOUS
  Administered 2019-07-11 (×2): 3 [IU] via SUBCUTANEOUS
  Administered 2019-07-12 (×3): 5 [IU] via SUBCUTANEOUS
  Administered 2019-07-12: 3 [IU] via SUBCUTANEOUS
  Administered 2019-07-12: 5 [IU] via SUBCUTANEOUS
  Administered 2019-07-12 – 2019-07-13 (×2): 8 [IU] via SUBCUTANEOUS
  Administered 2019-07-13: 3 [IU] via SUBCUTANEOUS
  Administered 2019-07-13: 15:00:00 5 [IU] via SUBCUTANEOUS
  Administered 2019-07-13: 3 [IU] via SUBCUTANEOUS
  Administered 2019-07-13: 5 [IU] via SUBCUTANEOUS
  Administered 2019-07-13: 2 [IU] via SUBCUTANEOUS
  Administered 2019-07-14: 8 [IU] via SUBCUTANEOUS
  Administered 2019-07-14 (×2): 3 [IU] via SUBCUTANEOUS
  Administered 2019-07-14: 5 [IU] via SUBCUTANEOUS
  Administered 2019-07-14 – 2019-07-15 (×2): 3 [IU] via SUBCUTANEOUS
  Administered 2019-07-15: 8 [IU] via SUBCUTANEOUS
  Administered 2019-07-15: 04:00:00 5 [IU] via SUBCUTANEOUS
  Administered 2019-07-15: 11 [IU] via SUBCUTANEOUS
  Administered 2019-07-15 (×2): 5 [IU] via SUBCUTANEOUS
  Administered 2019-07-15 – 2019-07-16 (×4): 3 [IU] via SUBCUTANEOUS
  Administered 2019-07-16: 5 [IU] via SUBCUTANEOUS
  Administered 2019-07-16: 13:00:00 3 [IU] via SUBCUTANEOUS
  Administered 2019-07-17: 5 [IU] via SUBCUTANEOUS
  Administered 2019-07-17: 3 [IU] via SUBCUTANEOUS
  Administered 2019-07-17: 16:00:00 11 [IU] via SUBCUTANEOUS
  Administered 2019-07-17: 21:00:00 8 [IU] via SUBCUTANEOUS
  Administered 2019-07-17: 11 [IU] via SUBCUTANEOUS
  Administered 2019-07-17: 5 [IU] via SUBCUTANEOUS
  Administered 2019-07-17 – 2019-07-18 (×2): 8 [IU] via SUBCUTANEOUS
  Administered 2019-07-18: 5 [IU] via SUBCUTANEOUS
  Administered 2019-07-18: 16:00:00 11 [IU] via SUBCUTANEOUS
  Administered 2019-07-18: 5 [IU] via SUBCUTANEOUS
  Administered 2019-07-18: 3 [IU] via SUBCUTANEOUS
  Administered 2019-07-19 (×2): 5 [IU] via SUBCUTANEOUS
  Administered 2019-07-19: 8 [IU] via SUBCUTANEOUS
  Administered 2019-07-19: 5 [IU] via SUBCUTANEOUS
  Administered 2019-07-19 – 2019-07-20 (×3): 8 [IU] via SUBCUTANEOUS
  Administered 2019-07-20: 3 [IU] via SUBCUTANEOUS
  Administered 2019-07-20: 8 [IU] via SUBCUTANEOUS
  Administered 2019-07-20: 5 [IU] via SUBCUTANEOUS
  Administered 2019-07-20: 3 [IU] via SUBCUTANEOUS
  Administered 2019-07-21: 5 [IU] via SUBCUTANEOUS
  Administered 2019-07-21 – 2019-07-22 (×3): 2 [IU] via SUBCUTANEOUS
  Administered 2019-07-22: 5 [IU] via SUBCUTANEOUS
  Administered 2019-07-22: 3 [IU] via SUBCUTANEOUS
  Administered 2019-07-23: 8 [IU] via SUBCUTANEOUS
  Administered 2019-07-23 (×2): 3 [IU] via SUBCUTANEOUS

## 2019-07-08 MED ORDER — FENTANYL 2500MCG IN NS 250ML (10MCG/ML) PREMIX INFUSION
25.0000 ug/h | INTRAVENOUS | Status: DC
  Administered 2019-07-08: 22:00:00 300 ug/h via INTRAVENOUS
  Administered 2019-07-08: 350 ug/h via INTRAVENOUS
  Administered 2019-07-09: 200 ug/h via INTRAVENOUS
  Administered 2019-07-09: 225 ug/h via INTRAVENOUS
  Administered 2019-07-10: 200 ug/h via INTRAVENOUS
  Administered 2019-07-10: 250 ug/h via INTRAVENOUS
  Administered 2019-07-11: 14:00:00 350 ug/h via INTRAVENOUS
  Administered 2019-07-11: 01:00:00 400 ug/h via INTRAVENOUS
  Administered 2019-07-11: 350 ug/h via INTRAVENOUS
  Administered 2019-07-11 – 2019-07-12 (×2): 375 ug/h via INTRAVENOUS
  Administered 2019-07-12 – 2019-07-14 (×8): 400 ug/h via INTRAVENOUS
  Filled 2019-07-08 (×19): qty 250

## 2019-07-08 MED ORDER — FENTANYL 2500MCG IN NS 250ML (10MCG/ML) PREMIX INFUSION
0.0000 ug/h | INTRAVENOUS | Status: DC
  Administered 2019-07-08: 01:00:00 50 ug/h via INTRAVENOUS
  Filled 2019-07-08 (×2): qty 250

## 2019-07-08 MED ORDER — ARTIFICIAL TEARS OPHTHALMIC OINT
1.0000 "application " | TOPICAL_OINTMENT | Freq: Three times a day (TID) | OPHTHALMIC | Status: DC
  Administered 2019-07-08 – 2019-07-11 (×10): 1 via OPHTHALMIC
  Filled 2019-07-08: qty 3.5

## 2019-07-08 MED ORDER — FENTANYL BOLUS VIA INFUSION
25.0000 ug | INTRAVENOUS | Status: DC | PRN
  Administered 2019-07-10 – 2019-07-13 (×10): 25 ug via INTRAVENOUS
  Filled 2019-07-08: qty 25

## 2019-07-08 MED ORDER — ORAL CARE MOUTH RINSE
15.0000 mL | OROMUCOSAL | Status: DC
  Administered 2019-07-08 – 2019-07-23 (×151): 15 mL via OROMUCOSAL

## 2019-07-08 MED ORDER — FENTANYL CITRATE (PF) 100 MCG/2ML IJ SOLN: 25.0000 ug | Freq: Once | INTRAMUSCULAR | Status: AC

## 2019-07-08 MED ORDER — VITAL HIGH PROTEIN PO LIQD
1000.0000 mL | ORAL | Status: DC
  Administered 2019-07-08 – 2019-07-13 (×5): 1000 mL

## 2019-07-08 MED ORDER — VITAL HIGH PROTEIN PO LIQD: 1000.0000 mL | ORAL | Status: DC

## 2019-07-08 MED ORDER — PHENYLEPHRINE 40 MCG/ML (10ML) SYRINGE FOR IV PUSH (FOR BLOOD PRESSURE SUPPORT)
PREFILLED_SYRINGE | INTRAVENOUS | Status: AC
  Filled 2019-07-08: qty 10

## 2019-07-08 MED ORDER — NOREPINEPHRINE 4 MG/250ML-% IV SOLN
2.0000 ug/min | INTRAVENOUS | Status: DC
  Administered 2019-07-08: 7 ug/min via INTRAVENOUS
  Administered 2019-07-08: 2 ug/min via INTRAVENOUS
  Administered 2019-07-09: 7 ug/min via INTRAVENOUS
  Filled 2019-07-08 (×2): qty 250

## 2019-07-08 MED ORDER — SODIUM CHLORIDE 0.9% FLUSH: 10.0000 mL | INTRAVENOUS | Status: DC | PRN

## 2019-07-08 MED ORDER — PRO-STAT SUGAR FREE PO LIQD
60.0000 mL | Freq: Two times a day (BID) | ORAL | Status: DC
  Administered 2019-07-08 – 2019-07-14 (×11): 60 mL
  Filled 2019-07-08 (×12): qty 60

## 2019-07-08 NOTE — Plan of Care (Signed)
Patient with increased WOB and decreased sats overnight. Not able to converse without getting labored. Sating in low 80 with max optiflow and non rebreather. Concern that he is getting fatigued. Spoke with family and they are okay with patient being intubated. Have made the decision to procede with intubation.   Newell Coral DO Internal Medicine/Pediatrics Pulmonary and Critical Care Fellow PGY-6

## 2019-07-08 NOTE — Progress Notes (Signed)
Abg on the following vent settings: PRVC mode, VT 64ml, rr18, 100%, +10 peep.  Results for Bruce Weaver, Bruce Weaver (MRN [id number]) as of 07/08/2019 02:12  Ref. Range 07/08/2019 01:15  FIO2 Unknown 100.00  pH, Arterial Latest Ref Range: 7.350 - 7.450  7.461 (H)  pCO2 arterial Latest Ref Range: 32.0 - 48.0 mmHg 47.2  pO2, Arterial Latest Ref Range: 83.0 - 108.0 mmHg 68.0 (L)  Acid-Base Excess Latest Ref Range: 0.0 - 2.0 mmol/L 8.4 (H)  Bicarbonate Latest Ref Range: 20.0 - 28.0 mmol/L 33.4 (H)  O2 Saturation Latest Units: % 93.4  Patient temperature Unknown 97.6  Allens test (pass/fail) Latest Ref Range: PASS  PASS

## 2019-07-08 NOTE — Progress Notes (Signed)
Mr Wm Riviello Intubated and on MV , PCCM on board. TRH will sign off.   Hosie Poisson. MD

## 2019-07-08 NOTE — Procedures (Signed)
Endotracheal Intubation Procedure Note  Indication for endotracheal intubation: respiratory failure. Airway Assessment: Mallampati Class: I (soft palate, uvula, fauces, and tonsillar pillars visible). Sedation: etomidate. Paralytic: succinylcholine. Lidocaine: no. Atropine: no. Equipment: Macintosh 4 laryngoscope blade. Cricoid Pressure: no. Number of attempts: 1. ETT location confirmed by by auscultation and by CXR.  Tyna Jaksch 07/08/2019

## 2019-07-08 NOTE — Progress Notes (Signed)
Patient difficult to sedate due to significant hypotension. CVC placed and and vasopressors initiated. Patient now synchronous with vent, RASS -5, BIS 48, TOF 4/4 on 20 mA. Paralytics initiated, bolus given as ordered. Protective dressings applied, lacrilube administered, TF paused 30 minutes prior to proning. ABG obtained via art line prior to pronation.

## 2019-07-08 NOTE — Procedures (Signed)
Arterial Catheter Insertion Procedure Note Bruce Weaver [id number] 06/22/1940  Procedure: Insertion of Arterial Catheter  Indications: Blood pressure monitoring and Frequent blood sampling  Procedure Details Consent: Unable to obtain consent because of emergent medical necessity. Time Out: Verified patient identification, verified procedure, site/side was marked, verified correct patient position, special equipment/implants available, medications/allergies/relevent history reviewed, required imaging and test results available.  Performed  Maximum sterile technique was used including antiseptics, cap, gloves, gown, hand hygiene, mask and sheet. Skin prep: Chlorhexidine; local anesthetic administered 20 gauge catheter was inserted into right radial artery using the Seldinger technique. ULTRASOUND GUIDANCE USED: NO Evaluation Blood flow good; BP tracing good. Complications: No apparent complications.   Johnette Abraham 07/08/2019

## 2019-07-08 NOTE — Progress Notes (Signed)
   NAME:  Bruce Weaver, MRN:  [id number], DOB:  [birth year], LOS: 4 ADMISSION DATE:  07/06/2019, CONSULTATION DATE:  07/05/19 REFERRING MD:  Lonny Prude, CHIEF COMPLAINT:  SOB   Brief History   79 year old man with metabolic syndrome presenting with severe COVID pneumonia.  History of present illness   79 year old man with metabolic syndrome presenting with severe COVID pneumonia.  Transferred from [place].  Diagnosed on 3/2.  Progressive hypoxemia and delirium prompting transfer to [facility]  In stepdown with higher O2 needs so PCCM consulted. Patient is AO to self only.  ROS as below with this caveat.  Past Medical History  DM2 HTN HLD Obesity  Significant Hospital Events   3/1 admitted to [facility] 3/2 diagnosed with COVID 3/5 4L to 15L, transferred to [facility] 3/6 PCCM consult 07/07/2019 required intubation Consults:  PCCM  Procedures:  07/07/2019 intubation  Significant Diagnostic Tests:  CXR- multifocal pneumonia CTA OSH neg for PE  Micro Data:  CRP low D dimer pretty low Pct neg  Antimicrobials:  Remdesivir   Interim history/subjective:  Now intubated on full mechanical ventilatory support and not doing well.  Required 10% FiO2 14 of PEEP and now is in shock.  Objective   Blood pressure (!) 112/44, pulse 69, temperature 98 F (36.7 C), temperature source Axillary, resp. rate 18, height 6' (1.829 m), weight 113.3 kg, SpO2 100 %.    Vent Mode: PRVC FiO2 (%):  [60 %-100 %] 100 % Set Rate:  [18 bmp] 18 bmp Vt Set:  RW:212346 mL] 620 mL PEEP:  [10 cmH20] 10 cmH20 Plateau Pressure:  [22 cmH20-24 cmH20] 24 cmH20   Intake/Output Summary (Last 24 hours) at 07/08/2019 0930 Last data filed at 07/08/2019 [id number] Gross per 24 hour  Intake 1319.61 ml  Output 1000 ml  Net 319.61 ml   Filed Weights   07/07/19 0426 07/07/19 1200 07/08/19 0339  Weight: 120.9 kg 120.9 kg 113.3 kg    Examination: General: Elderly male who is currently heavily sedated on full mechanical ventilatory  support HEENT: Endotracheal tube is in place gastric tube is in place Neuro: Heavily sedated  CV: Heart sounds are distant PULM: Diminished breath sounds throughout Vent pressure regulated volume control FIO2 100 PEEP 14  RATE 18  VT 620  GI: soft, bsx4 active  GU: Extremities: warm/dry, 1+ edema  Skin: no rashes or lesions   Assessment & Plan:  Acute hypoxemic respiratory failure due to COVID pneumonia.  S/p remdesivir. Intubated during the night of 07/07/2019 and requiring 100% FiO2 14 of PEEP Sedation protocol ARDS protocol May need  to proning as now intubated Continue Decadron   Shock multifactorial with sedation and ARDS Vasopressors as needed May need central line placement to monitor for status   Acute metabolic encephalopathy AKA delirium related to critical illness and steroids Currently intubated on full sedation  Best practice:  Diet: NPO. Pain/Anxiety/Delirium protocol (if indicated): Current sedation for tube tolerance VAP protocol (if indicated): In place DVT prophylaxis: 0.5 mg/kg lovenox BID GI prophylaxis: would do pepcid while on AC and steroids Glucose control: Sliding scale insulin protocol Mobility: BR Code Status: Full Family Communication: We will need to update family 07/08/1998 Disposition:  ICU   CC time: 64 min.   Richardson Landry Delman Goshorn ACNP Acute Care Nurse Practitioner [place] Please consult Amion 07/08/2019, 9:31 AM

## 2019-07-08 NOTE — Procedures (Signed)
Central Venous Catheter Insertion Procedure Note LEKENDRICK DIPPOLD [id number] [birth year]  Procedure: Insertion of Central Venous Catheter Indications: Assessment of intravascular volume, Drug and/or fluid administration and Frequent blood sampling  Procedure Details Consent: Risks of procedure as well as the alternatives and risks of each were explained to the (patient/caregiver).  Consent for procedure obtained. Time Out: Verified patient identification, verified procedure, site/side was marked, verified correct patient position, special equipment/implants available, medications/allergies/relevent history reviewed, required imaging and test results available.  Performed  Maximum sterile technique was used including antiseptics, cap, gloves, gown, hand hygiene, mask and sheet. Skin prep: Chlorhexidine; local anesthetic administered A antimicrobial bonded/coated triple lumen catheter was placed in the right internal jugular vein using the Seldinger technique. Ultrasound guidance used.Yes.   Catheter placed to 17 cm. Blood aspirated via all 3 ports and then flushed x 3. Line sutured x 2 and dressing applied.  Evaluation Blood flow good Complications: No apparent complications Patient did tolerate procedure well. Chest X-ray ordered to verify placement.  CXR: pending.  Richardson Landry Rexton Greulich ACNP Acute Care Nurse Practitioner [place] [facility] Pulmonary/Critical Care Please consult Amion 07/08/2019, 12:39 PM

## 2019-07-08 NOTE — Progress Notes (Addendum)
Nutrition Follow-up  RD working remotely.   DOCUMENTATION CODES:   Not applicable  INTERVENTION:  - will adjust TF regimen: Vital High Protein @ 45 ml/hr with 60 ml prostat BID with 100 ml free water every 4 hours. - this regimen + kcal from current propofol rate will provide 1929 kcal (101% estimated kcal need), 154 grams protein, and 1503 ml free water.    NUTRITION DIAGNOSIS:   Increased nutrient needs related to acute illness(COVID-19 infection) as evidenced by estimated needs. -ongoing  GOAL:   Patient will meet greater than or equal to 90% of their needs -to be met with TF regimen  MONITOR:   Vent status, TF tolerance, Labs, Weight trends  REASON FOR ASSESSMENT:   Ventilator, Consult Enteral/tube feeding initiation and management  ASSESSMENT:   79 y.o. male with medical history of type 2 DM, HTN, and HLD. Patient received the first dose of COVID vaccine on 2/25. He presented to the ED with generalized weakness and fatigue which have been worsening. He was admitted to [facility] on 3/1 and at that time was found to have multifocal PNA and he tested positive for COVID-19 on 3/2 and was started on remdesivir and steroids. CT chest negative for PE, indicated COVID-19 PNA. He was transferred from [facility] to [place] due to high oxygen requirement.  Significant Events: 3/6- admission 3/8- NGT placed in R nare 3/10- intubated  He was intubated at ~0035. Patient with NGT in place and order in place for Glucerna 1.5 @ 50 ml/hr with 30 ml prostat once/day and 100 ml free water every 4 hours. This regimen provides 1900 kcal, 114 grams protein, and 1511 ml free water. Will adjust TF regimen as outlined above. Re-estimated kcal need based on MSJ equation. Weight today is -7.4 kg/16 lb compared to admission weight.   Per notes: - acute hypoxemic respiratory failure due to COVID PNA - shock--multifactorial  - ARDS--possible proning - acute metabolic  encephalopathy/delirium   Patient is currently intubated on ventilator support MV: 12.3 L/min Temp (24hrs), Avg:98.9 F (37.2 C), Min:97.6 F (36.4 C), Max:99.5 F (37.5 C) Propofol: 17 ml/hr (449 kcal)   Labs reviewed; CBGs: 360, 359, 372 mg/dl, Na: 146 mmol/l, BUN: 45 mg/dl, Ca: 8.5 mg/dl. Medications reviewed; 20 mg IV pepcid/day, sliding scale novolog, 6 units novolog QID, 18 units levemir BID. Drips; propofol @ 25 mcg/kg/min, levo @ 8 mcg/min, fentanyl @ 350 mcg/hr.      NUTRITION - FOCUSED PHYSICAL EXAM:  unable to complete for COVID+ patients.   Diet Order:   Diet Order            Diet full liquid Room service appropriate? Yes; Fluid consistency: Thin  Diet effective now              EDUCATION NEEDS:   No education needs have been identified at this time  Skin:  Skin Assessment: Reviewed RN Assessment  Last BM:  3/9  Height:   Ht Readings from Last 1 Encounters:  07/08/19 6' (1.829 m)    Weight:   Wt Readings from Last 1 Encounters:  07/08/19 113.3 kg    Ideal Body Weight:     BMI:  Body mass index is 33.88 kg/m.  Estimated Nutritional Needs:   Kcal:  1900 kcal  Protein:  136-170 grams  Fluid:  >/= 2 L/day     Jarome Matin, MS, RD, LDN, CNSC Inpatient Clinical Dietitian RD pager # available in AMION  After hours/weekend pager # available  in [facility]

## 2019-07-09 ENCOUNTER — Inpatient Hospital Stay (HOSPITAL_COMMUNITY): Payer: Medicare HMO

## 2019-07-09 DIAGNOSIS — N179 Acute kidney failure, unspecified: Secondary | ICD-10-CM

## 2019-07-09 DIAGNOSIS — J9601 Acute respiratory failure with hypoxia: Secondary | ICD-10-CM

## 2019-07-09 LAB — BLOOD GAS, ARTERIAL
Acid-Base Excess: 5.8 mmol/L — ABNORMAL HIGH (ref 0.0–2.0)
Acid-Base Excess: 7.7 mmol/L — ABNORMAL HIGH (ref 0.0–2.0)
Bicarbonate: 31.6 mmol/L — ABNORMAL HIGH (ref 20.0–28.0)
Bicarbonate: 32.2 mmol/L — ABNORMAL HIGH (ref 20.0–28.0)
Drawn by: 331471
FIO2: 50
FIO2: 50
MECHVT: 460 mL
O2 Saturation: 94.5 %
O2 Saturation: 97.3 %
PEEP: 10 cmH2O
Patient temperature: 98.6
Patient temperature: 98.6
RATE: 26 resp/min
pCO2 arterial: 53.9 mmHg — ABNORMAL HIGH (ref 32.0–48.0)
pCO2 arterial: 45.9 mmHg (ref 32.0–48.0)
pH, Arterial: 7.386 (ref 7.350–7.450)
pH, Arterial: 7.459 — ABNORMAL HIGH (ref 7.350–7.450)
pO2, Arterial: 77.5 mmHg — ABNORMAL LOW (ref 83.0–108.0)
pO2, Arterial: 96.7 mmHg (ref 83.0–108.0)

## 2019-07-09 LAB — CBC WITH DIFFERENTIAL/PLATELET
Abs Immature Granulocytes: 0.57 10*3/uL — ABNORMAL HIGH (ref 0.00–0.07)
Basophils Absolute: 0.1 10*3/uL (ref 0.0–0.1)
Basophils Relative: 1 %
Eosinophils Absolute: 0 10*3/uL (ref 0.0–0.5)
Eosinophils Relative: 0 %
HCT: 39.8 % (ref 39.0–52.0)
Hemoglobin: 12.3 g/dL — ABNORMAL LOW (ref 13.0–17.0)
Immature Granulocytes: 5 %
Lymphocytes Relative: 8 %
Lymphs Abs: 1 10*3/uL (ref 0.7–4.0)
MCH: 30.5 pg (ref 26.0–34.0)
MCHC: 30.9 g/dL (ref 30.0–36.0)
MCV: 98.8 fL (ref 80.0–100.0)
Monocytes Absolute: 0.7 10*3/uL (ref 0.1–1.0)
Monocytes Relative: 6 %
Neutro Abs: 10.3 10*3/uL — ABNORMAL HIGH (ref 1.7–7.7)
Neutrophils Relative %: 80 %
Platelets: 309 10*3/uL (ref 150–400)
RBC: 4.03 MIL/uL — ABNORMAL LOW (ref 4.22–5.81)
RDW: 14.7 % (ref 11.5–15.5)
WBC: 12.6 10*3/uL — ABNORMAL HIGH (ref 4.0–10.5)
nRBC: 0.2 % (ref 0.0–0.2)

## 2019-07-09 LAB — C-REACTIVE PROTEIN: CRP: 4.6 mg/dL — ABNORMAL HIGH (ref <1.0)

## 2019-07-09 LAB — COMPREHENSIVE METABOLIC PANEL
ALT: 20 U/L (ref 0–44)
AST: 14 U/L — ABNORMAL LOW (ref 15–41)
Albumin: 2.7 g/dL — ABNORMAL LOW (ref 3.5–5.0)
Alkaline Phosphatase: 57 U/L (ref 38–126)
Anion gap: 10 (ref 5–15)
BUN: 61 mg/dL — ABNORMAL HIGH (ref 8–23)
CO2: 31 mmol/L (ref 22–32)
Calcium: 8.2 mg/dL — ABNORMAL LOW (ref 8.9–10.3)
Chloride: 103 mmol/L (ref 98–111)
Creatinine, Ser: 1.37 mg/dL — ABNORMAL HIGH (ref 0.61–1.24)
GFR calc Af Amer: 57 mL/min — ABNORMAL LOW (ref >60)
GFR calc non Af Amer: 49 mL/min — ABNORMAL LOW (ref >60)
Glucose, Bld: 324 mg/dL — ABNORMAL HIGH (ref 70–99)
Potassium: 3.6 mmol/L (ref 3.5–5.1)
Sodium: 144 mmol/L (ref 135–145)
Total Bilirubin: 0.8 mg/dL (ref 0.3–1.2)
Total Protein: 6.2 g/dL — ABNORMAL LOW (ref 6.5–8.1)

## 2019-07-09 LAB — MAGNESIUM: Magnesium: 2.6 mg/dL — ABNORMAL HIGH (ref 1.7–2.4)

## 2019-07-09 LAB — GLUCOSE, CAPILLARY
Glucose-Capillary: 262 mg/dL — ABNORMAL HIGH (ref 70–99)
Glucose-Capillary: 282 mg/dL — ABNORMAL HIGH (ref 70–99)
Glucose-Capillary: 287 mg/dL — ABNORMAL HIGH (ref 70–99)
Glucose-Capillary: 292 mg/dL — ABNORMAL HIGH (ref 70–99)
Glucose-Capillary: 247 mg/dL — ABNORMAL HIGH (ref 70–99)

## 2019-07-09 LAB — PROCALCITONIN: Procalcitonin: <0.1 ng/mL

## 2019-07-09 LAB — D-DIMER, QUANTITATIVE: D-Dimer, Quant: 0.5 ug/mL-FEU (ref 0.00–0.50)

## 2019-07-09 LAB — PHOSPHORUS: Phosphorus: 4.1 mg/dL (ref 2.5–4.6)

## 2019-07-09 MED ORDER — INSULIN DETEMIR 100 UNIT/ML ~~LOC~~ SOLN
2.0000 [IU] | Freq: Once | SUBCUTANEOUS | Status: AC
  Administered 2019-07-09: 2 [IU] via SUBCUTANEOUS
  Filled 2019-07-09: qty 0.02

## 2019-07-09 MED ORDER — INSULIN DETEMIR 100 UNIT/ML ~~LOC~~ SOLN
20.0000 [IU] | Freq: Two times a day (BID) | SUBCUTANEOUS | Status: DC
  Administered 2019-07-09: 20 [IU] via SUBCUTANEOUS
  Filled 2019-07-09 (×2): qty 0.2

## 2019-07-09 NOTE — Progress Notes (Signed)
Inpatient Diabetes Program Recommendations  AACE/ADA: New Consensus Statement on Inpatient Glycemic Control (2015)  Target Ranges:  Prepandial:   less than 140 mg/dL      Peak postprandial:   less than 180 mg/dL (1-2 hours)      Critically ill patients:  140 - 180 mg/dL   Lab Results  Component Value Date   GLUCAP 223 (H) 07/08/2019   HGBA1C 8.0 (H) 07/18/2019    Review of Glycemic Control Results for Bruce Weaver, Bruce Weaver (MRN [id number]) as of 07/09/2019 10:57  Ref. Range 07/08/2019 03:33 07/08/2019 08:47 07/08/2019 11:45 07/08/2019 15:52 07/08/2019 20:12  Glucose-Capillary Latest Ref Range: 70 - 99 mg/dL 360 (H) 359 (H) 372 (H) 254 (H) 223 (H)  Diabetes history: DM 2 Outpatient Diabetes medications:  Metformin 1000 mg bid, Actos 45 mg daily Januvia 100 mg daily Current orders for Inpatient glycemic control:  Levemir 18 units bid, Novolog moderate q 4 hours,  Decadron 6 mg daily Inpatient Diabetes Program Recommendations:    Blood sugars remain > goal.  Please consider IV insulin using COVID-19 glycemic control order set.  It has slightly higher goals of 140-200 and quicker transition criteria.    Thanks,  Adah Perl, RN, BC-ADM Inpatient Diabetes Coordinator Pager [phone] (8a-5p)

## 2019-07-09 NOTE — Progress Notes (Signed)
NAME:  Bruce Weaver, MRN:  [id number], DOB:  1941-02-10, LOS: 5 ADMISSION DATE:  07/11/2019, CONSULTATION DATE:  07/05/19 REFERRING MD:  Lonny Prude, CHIEF COMPLAINT:  SOB   Brief History   79 year old man with metabolic syndrome presenting with severe COVID pneumonia.  History of present illness   79 year old man with metabolic syndrome presenting with severe COVID pneumonia.  Transferred from [place].  Diagnosed on 3/2.  Progressive hypoxemia and delirium prompting transfer to [facility].  In stepdown with higher O2 needs so PCCM consulted. Patient is AO to self only.  ROS as below with this caveat.  Past Medical History  DM2 HTN HLD Obesity  Significant Hospital Events   3/1 admitted to [facility] 3/2 diagnosed with COVID 3/5 4L to 15L, transferred to [facility] 3/6 PCCM consult 07/07/2019 required intubation 07/07/2019 starting proning at 1600 hrs. Consults:  PCCM  Procedures:  07/07/2019 intubation>> 07/08/2018 right CVL>> Significant Diagnostic Tests:  CXR- multifocal pneumonia CTA OSH neg for PE  Micro Data:  CRP low D dimer pretty low Pct neg 07/09/2019 blood cultures x2>> 07/09/2019 sputum culture>> 07/09/2018 1 repeat procalcitonin>> Antimicrobials:  Remdesivir   Interim history/subjective:  Currently in prone position.  Improved oxygenation.  Objective   Blood pressure (!) 140/50, pulse 67, temperature 98.1 F (36.7 C), resp. rate (!) 26, height 6' (1.829 m), weight 109.8 kg, SpO2 92 %.    Vent Mode: PRVC FiO2 (%):  [50 %-100 %] 50 % Set Rate:  [18 bmp-26 bmp] 26 bmp Vt Set:  [460 mL-620 mL] 460 mL PEEP:  [10 cmH20-14 cmH20] 10 cmH20 Delta P (Amplitude):  [14] 14 Plateau Pressure:  [19 cmH20-25 cmH20] 19 cmH20   Intake/Output Summary (Last 24 hours) at 07/09/2019 0805 Last data filed at 07/09/2019 0800 Gross per 24 hour  Intake 2748.15 ml  Output 870 ml  Net 1878.15 ml   Filed Weights   07/07/19 1200 07/08/19 0339 07/09/19 0411  Weight: 120.9 kg 113.3 kg 109.8 kg     Examination: General: Elderly male who is currently in prone position. HEENT: Endo tracheal tube is in place Neuro: Currently in neuromuscular blockade and heavy sedation per ARDS protocol with proning CV: Heart sounds are regular PULM: Diminished breath sounds throughout Vent pressure regulated volume control FIO2 50% PEEP 10 RATE 20s to VT 460 which equals 6 cc   Decrease variation may reflect low stretch GI: soft, bsx4 active  GU: Extremities: warm/dry,  edema  Skin: no rashes or lesions    Assessment & Plan:  Acute hypoxemic respiratory failure due to COVID pneumonia.  S/p remdesivir. Intubated during the night of 07/07/2019 and requiring 100% FiO2 14 of PEEP Proning 16 hours at a time Changed to ARDS protocol low stretch Continue sedation neuromuscular blockade as needed Continue steroids   Hyperglycemia with steroid exacerbation CBG (last 3)  Recent Labs    07/08/19 1145 07/08/19 1552 07/08/19 2012  GLUCAP 372* 254* 223*  Sliding scale insulin    Shock multifactorial with sedation and ARDS Vasopressors as needed Central line placed 0000000   Acute metabolic encephalopathy AKA delirium related to critical illness and steroids He is intubated with full sedation neuromuscular blockade therefore a moot point  Best practice:  Diet: NPO. Pain/Anxiety/Delirium protocol (if indicated): Current sedation for tube tolerance VAP protocol (if indicated): In place DVT prophylaxis: 0.5 mg/kg lovenox BID GI prophylaxis: would do pepcid while on AC and steroids Glucose control: Sliding scale insulin protocol Mobility: BR Code Status: Full Family Communication:  10/2019 8:10 AM husband of daughter Luetta Nutting updated at length. Disposition:  ICU   CC time: 69 min.   Richardson Landry Ayron Fillinger ACNP Acute Care Nurse Practitioner [place] Please consult Amion 07/09/2019, 8:05 AM

## 2019-07-09 NOTE — Progress Notes (Signed)
Abg drawn on the following vent settings: PRVC mode, VT 422ml, rr26, 50%, +10peep.  Results for DONTARIOUS, BRANOM (MRN [id number]) as of 07/09/2019 05:02  Ref. Range 07/09/2019 04:30  FIO2 Unknown 50.00  pH, Arterial Latest Ref Range: 7.350 - 7.450  7.386  pCO2 arterial Latest Ref Range: 32.0 - 48.0 mmHg 53.9 (H)  pO2, Arterial Latest Ref Range: 83.0 - 108.0 mmHg 77.5 (L)  Acid-Base Excess Latest Ref Range: 0.0 - 2.0 mmol/L 5.8 (H)  Bicarbonate Latest Ref Range: 20.0 - 28.0 mmol/L 31.6 (H)  O2 Saturation Latest Units: % 94.5  Patient temperature Unknown 98.6  Allens test (pass/fail) Latest Ref Range: PASS  PASS

## 2019-07-09 NOTE — Plan of Care (Signed)
Progressing

## 2019-07-09 NOTE — Progress Notes (Signed)
Updated about bradycardia  We will stop paralytics Does not need proning if saturations are fine on current vent settings

## 2019-07-10 ENCOUNTER — Inpatient Hospital Stay (HOSPITAL_COMMUNITY): Payer: Medicare HMO

## 2019-07-10 LAB — CBC WITH DIFFERENTIAL/PLATELET
Abs Immature Granulocytes: 0.38 10*3/uL — ABNORMAL HIGH (ref 0.00–0.07)
Basophils Absolute: 0 10*3/uL (ref 0.0–0.1)
Basophils Relative: 0 %
Eosinophils Absolute: 0.1 10*3/uL (ref 0.0–0.5)
Eosinophils Relative: 1 %
HCT: 37.9 % — ABNORMAL LOW (ref 39.0–52.0)
Hemoglobin: 11.7 g/dL — ABNORMAL LOW (ref 13.0–17.0)
Immature Granulocytes: 5 %
Lymphocytes Relative: 11 %
Lymphs Abs: 0.9 10*3/uL (ref 0.7–4.0)
MCH: 30.9 pg (ref 26.0–34.0)
MCHC: 30.9 g/dL (ref 30.0–36.0)
MCV: 100 fL (ref 80.0–100.0)
Monocytes Absolute: 0.4 10*3/uL (ref 0.1–1.0)
Monocytes Relative: 5 %
Neutro Abs: 6.2 10*3/uL (ref 1.7–7.7)
Neutrophils Relative %: 78 %
Platelets: 216 10*3/uL (ref 150–400)
RBC: 3.79 MIL/uL — ABNORMAL LOW (ref 4.22–5.81)
RDW: 14.7 % (ref 11.5–15.5)
WBC: 8 10*3/uL (ref 4.0–10.5)
nRBC: 0.4 % — ABNORMAL HIGH (ref 0.0–0.2)

## 2019-07-10 LAB — BLOOD GAS, ARTERIAL
Acid-Base Excess: 6.5 mmol/L — ABNORMAL HIGH (ref 0.0–2.0)
Bicarbonate: 32.5 mmol/L — ABNORMAL HIGH (ref 20.0–28.0)
FIO2: 75
MECHVT: 460 mL
O2 Saturation: 93.7 %
Patient temperature: 98.6
RATE: 26 resp/min
pCO2 arterial: 54.4 mmHg — ABNORMAL HIGH (ref 32.0–48.0)
pH, Arterial: 7.393 (ref 7.350–7.450)
pO2, Arterial: 74.1 mmHg — ABNORMAL LOW (ref 83.0–108.0)

## 2019-07-10 LAB — GLUCOSE, CAPILLARY
Glucose-Capillary: 188 mg/dL — ABNORMAL HIGH (ref 70–99)
Glucose-Capillary: 175 mg/dL — ABNORMAL HIGH (ref 70–99)
Glucose-Capillary: 204 mg/dL — ABNORMAL HIGH (ref 70–99)
Glucose-Capillary: 216 mg/dL — ABNORMAL HIGH (ref 70–99)
Glucose-Capillary: 202 mg/dL — ABNORMAL HIGH (ref 70–99)
Glucose-Capillary: 181 mg/dL — ABNORMAL HIGH (ref 70–99)
Glucose-Capillary: 147 mg/dL — ABNORMAL HIGH (ref 70–99)
Glucose-Capillary: 116 mg/dL — ABNORMAL HIGH (ref 70–99)
Glucose-Capillary: 216 mg/dL — ABNORMAL HIGH (ref 70–99)
Glucose-Capillary: 280 mg/dL — ABNORMAL HIGH (ref 70–99)

## 2019-07-10 LAB — BASIC METABOLIC PANEL
Anion gap: 8 (ref 5–15)
BUN: 49 mg/dL — ABNORMAL HIGH (ref 8–23)
CO2: 33 mmol/L — ABNORMAL HIGH (ref 22–32)
Calcium: 8.2 mg/dL — ABNORMAL LOW (ref 8.9–10.3)
Chloride: 104 mmol/L (ref 98–111)
Creatinine, Ser: 1.11 mg/dL (ref 0.61–1.24)
GFR calc Af Amer: >60 mL/min (ref >60)
GFR calc non Af Amer: >60 mL/min (ref >60)
Glucose, Bld: 243 mg/dL — ABNORMAL HIGH (ref 70–99)
Potassium: 4.4 mmol/L (ref 3.5–5.1)
Sodium: 145 mmol/L (ref 135–145)

## 2019-07-10 LAB — URINE CULTURE: Culture: NO GROWTH

## 2019-07-10 LAB — MAGNESIUM: Magnesium: 2.4 mg/dL (ref 1.7–2.4)

## 2019-07-10 LAB — PROCALCITONIN: Procalcitonin: <0.1 ng/mL

## 2019-07-10 LAB — PHOSPHORUS: Phosphorus: 3.6 mg/dL (ref 2.5–4.6)

## 2019-07-10 MED ORDER — DOCUSATE SODIUM 50 MG/5ML PO LIQD
50.0000 mg | Freq: Two times a day (BID) | ORAL | Status: DC
  Administered 2019-07-10 – 2019-07-12 (×5): 50 mg
  Filled 2019-07-10 (×4): qty 10

## 2019-07-10 MED ORDER — INSULIN DETEMIR 100 UNIT/ML ~~LOC~~ SOLN
21.0000 [IU] | Freq: Two times a day (BID) | SUBCUTANEOUS | Status: DC
  Administered 2019-07-10 – 2019-07-16 (×14): 21 [IU] via SUBCUTANEOUS
  Filled 2019-07-10 (×16): qty 0.21

## 2019-07-10 MED ORDER — ATROPINE SULFATE 0.4 MG/ML IJ SOLN
0.4000 mg | Freq: Once | INTRAMUSCULAR | Status: DC
  Filled 2019-07-10: qty 1

## 2019-07-10 MED ORDER — ATROPINE SULFATE 0.4 MG/ML IJ SOLN
0.4000 mg | Freq: Once | INTRAMUSCULAR | Status: DC | PRN
  Filled 2019-07-10: qty 1

## 2019-07-10 MED ORDER — BISACODYL 10 MG RE SUPP: 10.0000 mg | Freq: Every day | RECTAL | Status: DC | PRN

## 2019-07-10 NOTE — Progress Notes (Addendum)
Pt's daughter, Aura Fey, called for an update while this RN was in pt's room. RN attempted to call pt back at given number of [phone] but was unable to reach daughter. RN left a voicemail with RNs work number telling the daughter she can reach RN at [phone].   Amber returned call and asked for sister Leilani Merl to be added to the contacts list. List updated. Amber also requested not to give updates to sister Roselyn Reef but did not give an explanation.

## 2019-07-10 NOTE — Progress Notes (Addendum)
NAME:  Bruce Weaver, MRN:  [id number], DOB:  1940-12-13, LOS: 6 ADMISSION DATE:  07/26/2019, CONSULTATION DATE:  07/05/19 REFERRING MD:  Lonny Prude, CHIEF COMPLAINT:  SOB   Brief History   79 year old man with metabolic syndrome presenting with severe COVID pneumonia.  History of present illness   79 year old man with metabolic syndrome presenting with severe COVID pneumonia.  Transferred from [place].  Diagnosed on 3/2.  Progressive hypoxemia and delirium prompting transfer to [facility].  In stepdown with higher O2 needs so PCCM consulted. Patient is AO to self only.  ROS as below with this caveat.  Past Medical History  DM2 HTN HLD Obesity  Significant Hospital Events   3/1 admitted to [facility] 3/2 diagnosed with COVID 3/5 4L to 15L, transferred to [facility] 3/6 PCCM consult 07/07/2019 required intubation 07/07/2019 starting proning at 1600 hrs. Consults:  PCCM  Procedures:  07/07/2019 intubation>> 07/08/2018 right CVL>> Significant Diagnostic Tests:  CXR- multifocal pneumonia CTA OSH neg for PE  Micro Data:  CRP low D dimer pretty low Pct neg 07/09/2019 blood cultures x2>> 07/09/2019 sputum culture>> 07/09/2018 1 repeat procalcitonin>> Antimicrobials:  Remdesivir   Interim history/subjective:  Has not required proning since 07/09/2019 2075% with O2 sats of 95%.  He does desaturate with any type of stress.  Objective   Blood pressure (!) 110/52, pulse 64, temperature 98.6 F (37 C), resp. rate (!) 26, height 6' (1.829 m), weight 109.8 kg, SpO2 94 %. CVP:  [6 mmHg-12 mmHg] 6 mmHg  Vent Mode: PRVC FiO2 (%):  [60 %-100 %] 70 % Set Rate:  [26 bmp] 26 bmp Vt Set:  [460 mL] 460 mL PEEP:  [10 cmH20-14 cmH20] 10 cmH20 Plateau Pressure:  [21 cmH20-28 cmH20] 21 cmH20   Intake/Output Summary (Last 24 hours) at 07/10/2019 0826 Last data filed at 07/10/2019 [id number] Gross per 24 hour  Intake 1469.96 ml  Output 1100 ml  Net 369.96 ml   Filed Weights   07/07/19 1200 07/08/19 0339 07/09/19 0411   Weight: 120.9 kg 113.3 kg 109.8 kg    Examination: General: Elderly male who is heavily sedated on full mechanical ventilatory support HEENT: Endotracheal tube gastric tube in place right IJ CVL is in place Neuro: Heavily sedated CV: Heart sounds regular regular rate and rhythm currently sinus rhythm 64 blood pressure 111/51 PULM: Coarse rhonchi bilaterally Vent pressure regulated volume control ARDS protocol  FIO2 75% PEEP 10 RATE 26 VT 460 which equals 6 cc/kg  GI: soft, bsx4 active  GU: Amber urine Extremities: warm/dry, 1+ edema  Skin: no rashes or lesions     Assessment & Plan:  Acute hypoxemic respiratory failure due to COVID pneumonia.  S/p remdesivir. Intubated during the night of 07/07/2019 and requiring 100% FiO2 14 of PEEP No longer requires proning ARDS protocol  Neuromuscular blockade has been discontinued Continue steroids and antibiotics Monitor culture data  Hyperglycemia with steroid exacerbation CBG (last 3)  Recent Labs    07/09/19 1050 07/09/19 1247 07/10/19 0748  GLUCAP 282* 247* 181*  Scale insulin protocol Noted improvement in glucose    Shock multifactorial with sedation and ARDS Currently not requiring vasopressor support with adequate blood pressure. Continue to monitor hemodynamics   Acute metabolic encephalopathy AKA delirium related to critical illness and steroids Remains intubated and sedated  Best practice:  Diet: NPO. Pain/Anxiety/Delirium protocol (if indicated): Current sedation for tube tolerance VAP protocol (if indicated): In place DVT prophylaxis: 0.5 mg/kg lovenox BID GI prophylaxis: would do pepcid while  on AC and steroids Glucose control: Sliding scale insulin protocol Mobility: BR Code Status: Full Family Communication: Daughter Museum/gallery conservator updated on 07/10/2019 Disposition:  ICU   CC time: 61 min.   Richardson Landry Krisalyn Yankowski ACNP Acute Care Nurse Practitioner [place] Please consult Amion 07/10/2019,  8:26 AM

## 2019-07-11 ENCOUNTER — Inpatient Hospital Stay (HOSPITAL_COMMUNITY): Payer: Medicare HMO

## 2019-07-11 LAB — CBC WITH DIFFERENTIAL/PLATELET
Abs Immature Granulocytes: 0.37 10*3/uL — ABNORMAL HIGH (ref 0.00–0.07)
Basophils Absolute: 0 10*3/uL (ref 0.0–0.1)
Basophils Relative: 0 %
Eosinophils Absolute: 0.1 10*3/uL (ref 0.0–0.5)
Eosinophils Relative: 2 %
HCT: 35.5 % — ABNORMAL LOW (ref 39.0–52.0)
Hemoglobin: 10.7 g/dL — ABNORMAL LOW (ref 13.0–17.0)
Immature Granulocytes: 5 %
Lymphocytes Relative: 12 %
Lymphs Abs: 0.9 10*3/uL (ref 0.7–4.0)
MCH: 30.5 pg (ref 26.0–34.0)
MCHC: 30.1 g/dL (ref 30.0–36.0)
MCV: 101.1 fL — ABNORMAL HIGH (ref 80.0–100.0)
Monocytes Absolute: 0.5 10*3/uL (ref 0.1–1.0)
Monocytes Relative: 6 %
Neutro Abs: 5.5 10*3/uL (ref 1.7–7.7)
Neutrophils Relative %: 75 %
Platelets: 211 10*3/uL (ref 150–400)
RBC: 3.51 MIL/uL — ABNORMAL LOW (ref 4.22–5.81)
RDW: 14.9 % (ref 11.5–15.5)
WBC: 7.4 10*3/uL (ref 4.0–10.5)
nRBC: 0.4 % — ABNORMAL HIGH (ref 0.0–0.2)

## 2019-07-11 LAB — BASIC METABOLIC PANEL
Anion gap: 8 (ref 5–15)
BUN: 58 mg/dL — ABNORMAL HIGH (ref 8–23)
CO2: 33 mmol/L — ABNORMAL HIGH (ref 22–32)
Calcium: 8.3 mg/dL — ABNORMAL LOW (ref 8.9–10.3)
Chloride: 104 mmol/L (ref 98–111)
Creatinine, Ser: 1.03 mg/dL (ref 0.61–1.24)
GFR calc Af Amer: >60 mL/min (ref >60)
GFR calc non Af Amer: >60 mL/min (ref >60)
Glucose, Bld: 220 mg/dL — ABNORMAL HIGH (ref 70–99)
Potassium: 4.3 mmol/L (ref 3.5–5.1)
Sodium: 145 mmol/L (ref 135–145)

## 2019-07-11 LAB — GLUCOSE, CAPILLARY
Glucose-Capillary: 193 mg/dL — ABNORMAL HIGH (ref 70–99)
Glucose-Capillary: 196 mg/dL — ABNORMAL HIGH (ref 70–99)
Glucose-Capillary: 198 mg/dL — ABNORMAL HIGH (ref 70–99)
Glucose-Capillary: 199 mg/dL — ABNORMAL HIGH (ref 70–99)
Glucose-Capillary: 239 mg/dL — ABNORMAL HIGH (ref 70–99)
Glucose-Capillary: 241 mg/dL — ABNORMAL HIGH (ref 70–99)
Glucose-Capillary: 261 mg/dL — ABNORMAL HIGH (ref 70–99)

## 2019-07-11 LAB — CULTURE, RESPIRATORY W GRAM STAIN: Gram Stain: NONE SEEN

## 2019-07-11 LAB — PROCALCITONIN: Procalcitonin: <0.1 ng/mL

## 2019-07-11 LAB — MAGNESIUM: Magnesium: 2.3 mg/dL (ref 1.7–2.4)

## 2019-07-11 LAB — PHOSPHORUS: Phosphorus: 3.8 mg/dL (ref 2.5–4.6)

## 2019-07-11 LAB — TRIGLYCERIDES: Triglycerides: 111 mg/dL (ref <150)

## 2019-07-11 NOTE — Progress Notes (Signed)
NAME:  Bruce Weaver, MRN:  [id number], DOB:  [birth year], LOS: 7 ADMISSION DATE:  07/02/2019, CONSULTATION DATE:  07/05/19 REFERRING MD:  Lonny Prude, CHIEF COMPLAINT:  SOB   Brief History   79 year old man with metabolic syndrome presenting with severe COVID pneumonia.  History of present illness   79 year old man with metabolic syndrome presenting with severe COVID pneumonia.  Transferred from [place].  Diagnosed on 3/2.  Progressive hypoxemia and delirium prompting transfer to [facility].  In stepdown with higher O2 needs so PCCM consulted. Patient is AO to self only.  ROS as below with this caveat.  Past Medical History  DM2 HTN HLD Obesity  Significant Hospital Events   3/1 admitted to [facility] 3/2 diagnosed with COVID 3/5 4L to 15L, transferred to [facility] 3/6 PCCM consult 07/07/2019 required intubation 07/07/2019 starting proning at 1600 hrs.  Consults:  PCCM  Procedures:  07/07/2019 intubation>> 07/08/2018 right CVL>>  Significant Diagnostic Tests:  CXR- multifocal pneumonia CTA OSH neg for PE  Micro Data:  CRP low D dimer pretty low Pct neg 07/09/2019 blood cultures x2>> 07/09/2019 sputum culture>> 07/09/2018 1 repeat procalcitonin>> Antimicrobials:  Remdesivir   Interim history/subjective:  Has not required proning since 07/09/2019 Appears comfortable O2 requirement stable  Objective   Blood pressure (!) 110/52, pulse (!) 101, temperature 99.1 F (37.3 C), resp. rate (!) 26, height 6' (1.829 m), weight 120.5 kg, SpO2 91 %. CVP:  [9 mmHg-13 mmHg] 13 mmHg  Vent Mode: PRVC FiO2 (%):  [55 %-70 %] 60 % Set Rate:  [26 bmp] 26 bmp Vt Set:  [460 mL] 460 mL PEEP:  [10 cmH20] 10 cmH20 Plateau Pressure:  [21 cmH20-22 cmH20] 21 cmH20   Intake/Output Summary (Last 24 hours) at 07/11/2019 0919 Last data filed at 07/11/2019 0400 Gross per 24 hour  Intake 2350.62 ml  Output 325 ml  Net 2025.62 ml   Filed Weights   07/08/19 0339 07/09/19 0411 07/11/19 0500  Weight: 113.3 kg 109.8  kg 120.5 kg    Examination: General: Elderly gentleman, does appear to be stable HEENT: Endotracheal tube gastric tube in place right IJ CVL is in place Neuro: Sedated CV: S1-S2 appreciated PULM: Does have rhonchi Vent pressure regulated volume control ARDS protocol  FIO2 60 percent PEEP 10 RATE 26 VT 460 which equals 6 cc/kg  GI: soft, bsx4 active  GU: Amber urine Extremities: warm/dry, 1+ edema  Skin: no rashes or lesions  Chest x-ray 07/11/2019-reviewed by myself, stable findings, multifocal pneumonia, atelectasis at bases   Assessment & Plan:  Acute hypoxemic respiratory failure secondary to Covid pneumonia Post remdesivir Oxygen requirement is improving -No longer requiring proning -Continue steroids -Monitor culture data  Hyperglycemia with steroid exacerbation CBG (last 3)  Recent Labs    07/11/19 0018 07/11/19 0335 07/11/19 0724  GLUCAP 239* 199* 193*  Scale insulin protocol Noted improvement in glucose  Multifactorial shock with sedation and ARDS -No longer on vasopressors -Need to monitor hemodynamics  Metabolic encephalopathy -Intubated and sedated  Diabetes -On Levemir -SSI -Continue to monitor     Best practice:  Diet: NPO. Pain/Anxiety/Delirium protocol (if indicated): Current sedation for tube tolerance VAP protocol (if indicated): In place DVT prophylaxis: 0.5 mg/kg lovenox BID GI prophylaxis: would do pepcid while on AC and steroids Glucose control: Sliding scale insulin protocol Mobility: BR Code Status: Full Family Communication: Daughter Museum/gallery conservator updated on 07/10/2019 Disposition:  ICU   The patient is critically ill with multiple organ systems failure and requires high  complexity decision making for assessment and support, frequent evaluation and titration of therapies, application of advanced monitoring technologies and extensive interpretation of multiple databases. Critical Care Time devoted to patient care services described in  this note independent of APP/resident time (if applicable)  is 30 minutes.   Sherrilyn Rist MD [place] [facility] Critical [facility] Personal pager: [phone] If unanswered, please page CCM On-call: [phone]

## 2019-07-11 NOTE — Progress Notes (Signed)
Per [place] MD O2 goal is 88% and above. Currently maintaining O2 at 90-92%. Will continue to monitor.

## 2019-07-11 NOTE — Progress Notes (Signed)
RN received phone call from [place]- daughter. RN informed her that information could not be released to her per request of Amber (see previous RN note). Family member became increasingly upset over the phone stating "information should be coming to me, not other children." RN again informed her that information could not be given at this time per the chart. RN attempted to call wife-Vanessa for clarification, but per family she is in hospital for [place]. RN attempt to call Chastity-daughter- RN left voicemail but no call received.

## 2019-07-11 NOTE — Progress Notes (Signed)
eLink Physician-Brief Progress Note Patient Name: Bruce Weaver DOB: 1941-04-24 MRN: [id number]   Date of Service  07/11/2019  HPI/Events of Note  ET aspirate grwoing enterobactor and enterofecalis. Discussed with Pharmacist. Video to room and discussed with bed side RN. On Vent. No fever. Off of levo yesterday. sats good. Supine. SBP 140.ET: not much secretions.   Labs: PCT < 0.10 for last 4 days. Wbc normal.  CxR stable.  eICU Interventions  After long discussion, decided not to cover with antibiotics tonight unless gets fever or BP down. Consider ID help. Looks like colonization/tracheitis?[name] 07/11/2019, 8:10 PM

## 2019-07-12 LAB — BASIC METABOLIC PANEL
Anion gap: 7 (ref 5–15)
BUN: 55 mg/dL — ABNORMAL HIGH (ref 8–23)
CO2: 34 mmol/L — ABNORMAL HIGH (ref 22–32)
Calcium: 8.6 mg/dL — ABNORMAL LOW (ref 8.9–10.3)
Chloride: 106 mmol/L (ref 98–111)
Creatinine, Ser: 0.92 mg/dL (ref 0.61–1.24)
GFR calc Af Amer: >60 mL/min (ref >60)
GFR calc non Af Amer: >60 mL/min (ref >60)
Glucose, Bld: 237 mg/dL — ABNORMAL HIGH (ref 70–99)
Potassium: 4.5 mmol/L (ref 3.5–5.1)
Sodium: 147 mmol/L — ABNORMAL HIGH (ref 135–145)

## 2019-07-12 LAB — CBC WITH DIFFERENTIAL/PLATELET
Abs Immature Granulocytes: 0.27 10*3/uL — ABNORMAL HIGH (ref 0.00–0.07)
Basophils Absolute: 0 10*3/uL (ref 0.0–0.1)
Basophils Relative: 0 %
Eosinophils Absolute: 0.1 10*3/uL (ref 0.0–0.5)
Eosinophils Relative: 1 %
HCT: 36.4 % — ABNORMAL LOW (ref 39.0–52.0)
Hemoglobin: 11.1 g/dL — ABNORMAL LOW (ref 13.0–17.0)
Immature Granulocytes: 3 %
Lymphocytes Relative: 12 %
Lymphs Abs: 1 10*3/uL (ref 0.7–4.0)
MCH: 30.7 pg (ref 26.0–34.0)
MCHC: 30.5 g/dL (ref 30.0–36.0)
MCV: 100.8 fL — ABNORMAL HIGH (ref 80.0–100.0)
Monocytes Absolute: 0.7 10*3/uL (ref 0.1–1.0)
Monocytes Relative: 8 %
Neutro Abs: 6.6 10*3/uL (ref 1.7–7.7)
Neutrophils Relative %: 76 %
Platelets: 216 10*3/uL (ref 150–400)
RBC: 3.61 MIL/uL — ABNORMAL LOW (ref 4.22–5.81)
RDW: 14.6 % (ref 11.5–15.5)
WBC: 8.7 10*3/uL (ref 4.0–10.5)
nRBC: 0.2 % (ref 0.0–0.2)

## 2019-07-12 LAB — BLOOD GAS, ARTERIAL
Acid-Base Excess: 7.1 mmol/L — ABNORMAL HIGH (ref 0.0–2.0)
Bicarbonate: 33.9 mmol/L — ABNORMAL HIGH (ref 20.0–28.0)
O2 Saturation: 92.6 %
Patient temperature: 98.6
pCO2 arterial: 57.9 mmHg — ABNORMAL HIGH (ref 32.0–48.0)
pH, Arterial: 7.385 (ref 7.350–7.450)
pO2, Arterial: 69.9 mmHg — ABNORMAL LOW (ref 83.0–108.0)

## 2019-07-12 LAB — HEMOGLOBIN AND HEMATOCRIT, BLOOD
HCT: 36.8 % — ABNORMAL LOW (ref 39.0–52.0)
Hemoglobin: 11.2 g/dL — ABNORMAL LOW (ref 13.0–17.0)

## 2019-07-12 LAB — GLUCOSE, CAPILLARY
Glucose-Capillary: 170 mg/dL — ABNORMAL HIGH (ref 70–99)
Glucose-Capillary: 231 mg/dL — ABNORMAL HIGH (ref 70–99)
Glucose-Capillary: 231 mg/dL — ABNORMAL HIGH (ref 70–99)

## 2019-07-12 MED ORDER — DOCUSATE SODIUM 50 MG/5ML PO LIQD
100.0000 mg | Freq: Two times a day (BID) | ORAL | Status: DC
  Administered 2019-07-12 – 2019-07-15 (×6): 100 mg
  Filled 2019-07-12 (×6): qty 10

## 2019-07-12 MED ORDER — SENNOSIDES 8.8 MG/5ML PO SYRP
5.0000 mL | ORAL_SOLUTION | Freq: Two times a day (BID) | ORAL | Status: DC
  Filled 2019-07-12: qty 5

## 2019-07-12 NOTE — Progress Notes (Signed)
Results for Bruce Weaver, Bruce Weaver (MRN [id number]) as of 07/12/2019 05:32  Ref. Range 07/12/2019 04:30  pH, Arterial Latest Ref Range: 7.350 - 7.450  7.385  pCO2 arterial Latest Ref Range: 32.0 - 48.0 mmHg 57.9 (H)  pO2, Arterial Latest Ref Range: 83.0 - 108.0 mmHg 69.9 (L)  Acid-Base Excess Latest Ref Range: 0.0 - 2.0 mmol/L 7.1 (H)  Bicarbonate Latest Ref Range: 20.0 - 28.0 mmol/L 33.9 (H)  O2 Saturation Latest Units: % 92.6  Patient temperature Unknown 98.6    ABG results are based on Vent settings: 460 /26 /70% /+10

## 2019-07-12 NOTE — Progress Notes (Signed)
eLink Physician-Brief Progress Note Patient Name: Bruce Weaver DOB: 1941/04/27 MRN: [id number]   Date of Service  07/12/2019  HPI/Events of Note  AM labs. ARDS. Vent.   eICU Interventions  ABG, BMP, CBC ordered     Intervention Category Minor Interventions: Routine modifications to care plan (e.g. PRN medications for pain, fever)  [name] 07/12/2019, 4:30 AM

## 2019-07-12 NOTE — Progress Notes (Signed)
NAME:  Bruce Weaver, MRN:  [id number], DOB:  Mar 17, 1941, LOS: 8 ADMISSION DATE:  07/08/2019, CONSULTATION DATE:  07/05/19 REFERRING MD:  Lonny Prude, CHIEF COMPLAINT:  SOB   Brief History   79 year old man with metabolic syndrome presenting with severe COVID pneumonia.  History of present illness   79 year old man with metabolic syndrome presenting with severe COVID pneumonia.  Transferred from [place].  Diagnosed on 3/2.  Progressive hypoxemia and delirium prompting transfer to [facility].  In stepdown with higher O2 needs so PCCM consulted. Patient is AO to self only.    Past Medical History  DM2 HTN HLD Obesity  Significant Hospital Events   3/1 admitted to [facility] 3/2 diagnosed with COVID 3/5 4L to 15L, transferred to [facility] 3/6 PCCM consult 07/07/2019 required intubation 07/07/2019 starting proning at 1600 hrs. 3/13 partial dislodgment of his arterial line-did have moderate bleeding  Consults:  PCCM  Procedures:  07/07/2019 intubation>> 07/08/2018 right CVL>>  Significant Diagnostic Tests:  CXR- multifocal pneumonia CTA OSH neg for PE  Micro Data:  CRP low D dimer pretty low Pct neg 07/09/2019 blood cultures x2>> 07/09/2019 sputum culture>> 07/09/2018 1 repeat procalcitonin>>  Antimicrobials:  Remdesivir completed  Interim history/subjective:  Has not required proning since 07/09/2019 Appears comfortable O2 requirement stable-currently on 70%, PEEP of 10  Objective   Blood pressure (!) 152/58, pulse 71, temperature 99.1 F (37.3 C), resp. rate (!) 26, height 6' (1.829 m), weight 121.5 kg, SpO2 (!) 89 %.    Vent Mode: PRVC FiO2 (%):  [60 %-70 %] 70 % Set Rate:  [26 bmp] 26 bmp Vt Set:  [460 mL] 460 mL PEEP:  [10 cmH20] 10 cmH20 Plateau Pressure:  [20 cmH20-24 cmH20] 23 cmH20   Intake/Output Summary (Last 24 hours) at 07/12/2019 [id number] Last data filed at 07/12/2019 0800 Gross per 24 hour  Intake 2354.09 ml  Output 800 ml  Net 1554.09 ml   Filed Weights   07/09/19 0411  07/11/19 0500 07/12/19 0500  Weight: 109.8 kg 120.5 kg 121.5 kg    Examination: General: Elderly gentleman, not uncomfortable HEENT: ET tube in place Neuro: Sedated CV: S1-S2 appreciated PULM: Mild rhonchi bilaterally Vent pressure regulated volume control ARDS protocol  FIO2 70% PEEP 10 RATE 26 VT 460 which equals 6 cc/kg  GI: soft, bsx4 active  GU: Amber urine Extremities: warm/dry, 1+ edema  Skin: no rashes or lesions  Chest x-ray 07/11/2019-reviewed by myself, stable findings, multifocal pneumonia, atelectasis at bases   No bowel motions   Assessment & Plan:   Acute hypoxemic respiratory failure secondary to Covid pneumonia Post remdesivir Oxygen requirement is improving -No longer requiring proning -Continue steroids-Decadron  Multifactorial shock with sedation and ARDS -No longer on vasopressors -Need to monitor hemodynamics  Metabolic encephalopathy -Intubated and sedated -Only requiring fentanyl at present  Diabetes -On Levemir -SSI -Continue to monitor  Constipation -Dulcolax -Colace increased to 100 -Decrease once he has a bowel movement  Did have partial dislodgment of arterial line Will get H&H   Best practice:  Diet: Tube feeds per protocol Pain/Anxiety/Delirium protocol (if indicated): Current sedation for tube tolerance VAP protocol (if indicated): In place DVT prophylaxis: 0.5 mg/kg lovenox BID GI prophylaxis: would do pepcid while on AC and steroids Glucose control: Sliding scale insulin protocol Mobility: BR Code Status: Full Family Communication: Daughter Museum/gallery conservator updated on 07/10/2019 Disposition:  ICU   The patient is critically ill with multiple organ systems failure and requires high complexity decision making for assessment  and support, frequent evaluation and titration of therapies, application of advanced monitoring technologies and extensive interpretation of multiple databases. Critical Care Time devoted to patient care  services described in this note independent of APP/resident time (if applicable)  is 33 minutes.   Sherrilyn Rist MD [place] [facility] Personal pager: [phone] If unanswered, please page CCM On-call: [phone]

## 2019-07-13 ENCOUNTER — Inpatient Hospital Stay (HOSPITAL_COMMUNITY): Payer: Medicare HMO

## 2019-07-13 DIAGNOSIS — E1165 Type 2 diabetes mellitus with hyperglycemia: Secondary | ICD-10-CM

## 2019-07-13 LAB — CBC
HCT: 34.5 % — ABNORMAL LOW (ref 39.0–52.0)
Hemoglobin: 10.4 g/dL — ABNORMAL LOW (ref 13.0–17.0)
MCH: 30.5 pg (ref 26.0–34.0)
MCHC: 30.1 g/dL (ref 30.0–36.0)
MCV: 101.2 fL — ABNORMAL HIGH (ref 80.0–100.0)
Platelets: 200 10*3/uL (ref 150–400)
RBC: 3.41 MIL/uL — ABNORMAL LOW (ref 4.22–5.81)
RDW: 14.5 % (ref 11.5–15.5)
WBC: 8.8 10*3/uL (ref 4.0–10.5)
nRBC: 0 % (ref 0.0–0.2)

## 2019-07-13 LAB — BLOOD GAS, ARTERIAL
Acid-Base Excess: 9.6 mmol/L — ABNORMAL HIGH (ref 0.0–2.0)
Bicarbonate: 36.4 mmol/L — ABNORMAL HIGH (ref 20.0–28.0)
O2 Saturation: 87.2 %
Patient temperature: 98.6
pCO2 arterial: 64.3 mmHg — ABNORMAL HIGH (ref 32.0–48.0)
pH, Arterial: 7.371 (ref 7.350–7.450)
pO2, Arterial: 60.7 mmHg — ABNORMAL LOW (ref 83.0–108.0)

## 2019-07-13 LAB — GLUCOSE, CAPILLARY
Glucose-Capillary: 142 mg/dL — ABNORMAL HIGH (ref 70–99)
Glucose-Capillary: 151 mg/dL — ABNORMAL HIGH (ref 70–99)
Glucose-Capillary: 194 mg/dL — ABNORMAL HIGH (ref 70–99)
Glucose-Capillary: 216 mg/dL — ABNORMAL HIGH (ref 70–99)
Glucose-Capillary: 229 mg/dL — ABNORMAL HIGH (ref 70–99)
Glucose-Capillary: 293 mg/dL — ABNORMAL HIGH (ref 70–99)

## 2019-07-13 LAB — BASIC METABOLIC PANEL
Anion gap: 9 (ref 5–15)
BUN: 56 mg/dL — ABNORMAL HIGH (ref 8–23)
CO2: 33 mmol/L — ABNORMAL HIGH (ref 22–32)
Calcium: 8.6 mg/dL — ABNORMAL LOW (ref 8.9–10.3)
Chloride: 104 mmol/L (ref 98–111)
Creatinine, Ser: 0.91 mg/dL (ref 0.61–1.24)
GFR calc Af Amer: >60 mL/min (ref >60)
GFR calc non Af Amer: >60 mL/min (ref >60)
Glucose, Bld: 194 mg/dL — ABNORMAL HIGH (ref 70–99)
Potassium: 4.6 mmol/L (ref 3.5–5.1)
Sodium: 146 mmol/L — ABNORMAL HIGH (ref 135–145)

## 2019-07-13 MED ORDER — MIDAZOLAM HCL 2 MG/2ML IJ SOLN
1.0000 mg | INTRAMUSCULAR | Status: DC | PRN
  Administered 2019-07-13 – 2019-07-21 (×10): 1 mg via INTRAVENOUS
  Filled 2019-07-13 (×9): qty 2

## 2019-07-13 MED ORDER — OXYCODONE HCL 5 MG PO TABS
5.0000 mg | ORAL_TABLET | Freq: Four times a day (QID) | ORAL | Status: DC
  Administered 2019-07-13 – 2019-07-17 (×16): 5 mg
  Filled 2019-07-13 (×16): qty 1

## 2019-07-13 MED ORDER — FREE WATER
200.0000 mL | Status: DC
  Administered 2019-07-13 – 2019-07-14 (×8): 200 mL

## 2019-07-13 MED ORDER — POLYETHYLENE GLYCOL 3350 17 G PO PACK
17.0000 g | PACK | Freq: Every day | ORAL | Status: DC
  Administered 2019-07-13 – 2019-07-21 (×8): 17 g
  Filled 2019-07-13 (×9): qty 1

## 2019-07-13 NOTE — Progress Notes (Signed)
Patient wife updated about patient being moved to [facility].

## 2019-07-13 NOTE — Progress Notes (Signed)
PCCM:  I called and spoke with patients wife Bruce Weaver. She was thankful for the call.   Updates given.   [place] [facility] 07/13/2019 10:22 AM

## 2019-07-13 NOTE — Progress Notes (Signed)
NAME:  Bruce Weaver, MRN:  [id number], DOB:  1940-09-09, LOS: 9 ADMISSION DATE:  07/15/2019, CONSULTATION DATE:  07/05/19 REFERRING MD:  Lonny Prude, CHIEF COMPLAINT:  SOB   Brief History   79 year old man with metabolic syndrome presenting with severe COVID pneumonia.  History of present illness   79 year old man with metabolic syndrome presenting with severe COVID pneumonia.  Transferred from [place].  Diagnosed on 3/2.  Progressive hypoxemia and delirium prompting transfer to [facility].  In stepdown with higher O2 needs so PCCM consulted. Patient is AO to self only.    Past Medical History  DM2 HTN HLD Obesity  Significant Hospital Events   3/1 admitted to [facility] 3/2 diagnosed with COVID 3/5 4L to 15L, transferred to [facility] 3/6 PCCM consult 07/07/2019 required intubation 07/07/2019 starting proning at 1600 hrs. 3/13 partial dislodgment of his arterial line-did have moderate bleeding  Consults:  PCCM  Procedures:  07/07/2019 intubation>> 07/08/2018 right CVL>>  Significant Diagnostic Tests:  CXR- multifocal pneumonia CTA OSH neg for PE  Micro Data:  CRP low D dimer pretty low Pct neg 07/09/2019 blood cultures x2>> 07/09/2019 sputum culture>> 07/09/2018 1 repeat procalcitonin>>  Antimicrobials:  Remdesivir completed  Interim history/subjective:   Intubated on mechanical vent critically ill  Objective   Blood pressure (!) 111/47, pulse 78, temperature 99.9 F (37.7 C), resp. rate (!) 26, height 6' (1.829 m), weight 121.5 kg, SpO2 (!) 88 %. CVP:  [8 mmHg] 8 mmHg  Vent Mode: PRVC FiO2 (%):  [70 %-80 %] 70 % Set Rate:  [26 bmp] 26 bmp Vt Set:  [460 mL] 460 mL PEEP:  [10 cmH20] 10 cmH20 Plateau Pressure:  [19 cmH20-24 cmH20] 19 cmH20   Intake/Output Summary (Last 24 hours) at 07/13/2019 1001 Last data filed at 07/13/2019 0800 Gross per 24 hour  Intake 3121.29 ml  Output 1550 ml  Net 1571.29 ml   Filed Weights   07/11/19 0500 07/12/19 0500 07/13/19 0500  Weight: 120.5 kg  120.5 kg 121.5 kg    Examination: General appearance: 79 y.o., male, critically ill, intubated on life support  Eyes: anicteric sclerae, moist conjunctivae;  HENT: NCAT; ETT in place  Neck: Trachea midline Lungs: BL mechanically ventilated breaths  CV: RRR, S1, S2 Abdomen: Soft, non-tender; non-distended Extremities: No peripheral edema, radial and DP pulses present bilaterally  Skin: Normal temperature, turgor and texture; no rash Psych: sedated on vent  Neuro: Alert to voice, he will open his eyes    Assessment & Plan:    Acute hypoxemic respiratory failure secondary to Covid pneumonia Post remdesivir, remains on decadron Continue mechanical ventilation per ARDS protocol Target TVol 6-8cc/kgIBW Target Plateau Pressure < 30cm H20 Target driving pressure less than 15 cm of water Target PaO2 55-65: titrate PEEP/FiO2 per protocol As long as PaO2 to FiO2 ratio is less than 1:150 position in prone position for 16 hours a day Ventilator associated pneumonia prevention protocol. 3/15: weaning Fio2, PF 75, on 80%, DP 14, in PRVC, comfortable an passive on vent with fent, he will open eyes to voice. Repeat CXR today   Multifactorial shock with sedation and ARDS - no longer on pressors  Acute Metabolic Encephalopathy - related to sedation  - improved, we will continue to observe closely  - related to above   Diabetes Mellitus II, with Hyperglycemia, secondary to steroids  - levemir + SSI  - Goal CBGs 140-180  Constipation - scheduled bowel regimen   Hypernatremia, insensible losses  - increase FW  Anemia, multifactorial, related to critical illness and blood draws  - continue to observe   Best practice:  Diet: Tube feeds per protocol Pain/Anxiety/Delirium protocol (if indicated): Current sedation for tube tolerance VAP protocol (if indicated): In place DVT prophylaxis: 0.5 mg/kg lovenox BID GI prophylaxis: would do pepcid while on AC and steroids Glucose control:  Sliding scale insulin protocol Mobility: BR Code Status: Full Family Communication: we will up date family today  Disposition:  ICU   This patient is critically ill with multiple organ system failure; which, requires frequent high complexity decision making, assessment, support, evaluation, and titration of therapies. This was completed through the application of advanced monitoring technologies and extensive interpretation of multiple databases. During this encounter critical care time was devoted to patient care services described in this note for 32 minutes.  Garner Nash, DO [place] [facility] 07/13/2019 10:20 AM

## 2019-07-13 NOTE — Progress Notes (Signed)
ANTICOAGULATION CONSULT NOTE - Follow Up Consult  Pharmacy Consult for Lovenox Indication: VTE prophylaxis  No Known Allergies  Patient Measurements: Height: 6' (182.9 cm) Weight: 267 lb 13.7 oz (121.5 kg) IBW/kg (Calculated) : 77.6  Vital Signs: Temp: 99.9 F (37.7 C) (03/15 1100) Temp Source: Bladder (03/15 0800) Pulse Rate: 72 (03/15 1100)  Labs: Recent Labs    07/11/19 0500 07/11/19 0500 07/12/19 0430 07/12/19 0430 07/12/19 0930 07/13/19 0420  HGB 10.7*   < > 11.1*   < > 11.2* 10.4*  HCT 35.5*   < > 36.4*  --  36.8* 34.5*  PLT 211  --  216  --   --  200  CREATININE 1.03  --  0.92  --   --  0.91   < > = values in this interval not displayed.    Estimated Creatinine Clearance: 91.5 mL/min (by C-G formula based on SCr of 0.91 mg/dL).   Assessment: 40 yoM admitted on 3/6 with COVID-19 pneumonia.  Pharmacy is consulted for Lovenox dosing for VTE prophylaxis. Pt transferred from [facility] where he was on therapeutic LMWH. D-dimer reportedly peaked there at 1.02. CTA negative for PE. LD LMWH PTA at [facility] was 3/5 @ 2040.  Wt: 120 kg, BMI:  36 Scr:  <1, CrCl >30 ml/hr  CBC:  Hgb low/stable, Plt wnl D-dimer 0.5 (3/11)  Goal of Therapy:  Monitor platelets by anticoagulation protocol: Yes   Plan:  Lovenox 0.5 mg/kg (60mg ) [place] q12h Monitor CBC and renal function, adjust as needed  Gretta Arab PharmD, BCPS Pager: [phone] 07/13/2019 11:34 AM

## 2019-07-14 ENCOUNTER — Inpatient Hospital Stay (HOSPITAL_COMMUNITY): Payer: Medicare HMO

## 2019-07-14 LAB — CBC
HCT: 33.6 % — ABNORMAL LOW (ref 39.0–52.0)
Hemoglobin: 10 g/dL — ABNORMAL LOW (ref 13.0–17.0)
MCH: 30.8 pg (ref 26.0–34.0)
MCHC: 29.8 g/dL — ABNORMAL LOW (ref 30.0–36.0)
MCV: 103.4 fL — ABNORMAL HIGH (ref 80.0–100.0)
Platelets: 203 10*3/uL (ref 150–400)
RBC: 3.25 MIL/uL — ABNORMAL LOW (ref 4.22–5.81)
RDW: 14.5 % (ref 11.5–15.5)
WBC: 8.4 10*3/uL (ref 4.0–10.5)
nRBC: 0 % (ref 0.0–0.2)

## 2019-07-14 LAB — POCT I-STAT 7, (LYTES, BLD GAS, ICA,H+H)
Acid-Base Excess: 11 mmol/L — ABNORMAL HIGH (ref 0.0–2.0)
Bicarbonate: 37.4 mmol/L — ABNORMAL HIGH (ref 20.0–28.0)
Calcium, Ion: 1.28 mmol/L (ref 1.15–1.40)
HCT: 30 % — ABNORMAL LOW (ref 39.0–52.0)
Hemoglobin: 10.2 g/dL — ABNORMAL LOW (ref 13.0–17.0)
O2 Saturation: 89 %
Potassium: 4.5 mmol/L (ref 3.5–5.1)
Sodium: 143 mmol/L (ref 135–145)
TCO2: 39 mmol/L — ABNORMAL HIGH (ref 22–32)
pCO2 arterial: 61.2 mmHg — ABNORMAL HIGH (ref 32.0–48.0)
pH, Arterial: 7.394 (ref 7.350–7.450)
pO2, Arterial: 58 mmHg — ABNORMAL LOW (ref 83.0–108.0)

## 2019-07-14 LAB — BASIC METABOLIC PANEL
Anion gap: 7 (ref 5–15)
BUN: 62 mg/dL — ABNORMAL HIGH (ref 8–23)
CO2: 34 mmol/L — ABNORMAL HIGH (ref 22–32)
Calcium: 8.7 mg/dL — ABNORMAL LOW (ref 8.9–10.3)
Chloride: 103 mmol/L (ref 98–111)
Creatinine, Ser: 1.06 mg/dL (ref 0.61–1.24)
GFR calc Af Amer: >60 mL/min (ref >60)
GFR calc non Af Amer: >60 mL/min (ref >60)
Glucose, Bld: 225 mg/dL — ABNORMAL HIGH (ref 70–99)
Potassium: 4.7 mmol/L (ref 3.5–5.1)
Sodium: 144 mmol/L (ref 135–145)

## 2019-07-14 LAB — GLUCOSE, CAPILLARY
Glucose-Capillary: 113 mg/dL — ABNORMAL HIGH (ref 70–99)
Glucose-Capillary: 171 mg/dL — ABNORMAL HIGH (ref 70–99)
Glucose-Capillary: 174 mg/dL — ABNORMAL HIGH (ref 70–99)
Glucose-Capillary: 179 mg/dL — ABNORMAL HIGH (ref 70–99)
Glucose-Capillary: 195 mg/dL — ABNORMAL HIGH (ref 70–99)
Glucose-Capillary: 204 mg/dL — ABNORMAL HIGH (ref 70–99)
Glucose-Capillary: 210 mg/dL — ABNORMAL HIGH (ref 70–99)
Glucose-Capillary: 229 mg/dL — ABNORMAL HIGH (ref 70–99)
Glucose-Capillary: 230 mg/dL — ABNORMAL HIGH (ref 70–99)
Glucose-Capillary: 258 mg/dL — ABNORMAL HIGH (ref 70–99)

## 2019-07-14 LAB — CULTURE, BLOOD (ROUTINE X 2)
Culture: NO GROWTH
Culture: NO GROWTH

## 2019-07-14 LAB — PHOSPHORUS: Phosphorus: 3.7 mg/dL (ref 2.5–4.6)

## 2019-07-14 LAB — TRIGLYCERIDES: Triglycerides: 93 mg/dL (ref <150)

## 2019-07-14 LAB — MAGNESIUM: Magnesium: 2.3 mg/dL (ref 1.7–2.4)

## 2019-07-14 MED ORDER — VITAL 1.5 CAL PO LIQD
1000.0000 mL | ORAL | Status: DC
  Administered 2019-07-14 – 2019-07-20 (×4): 1000 mL
  Filled 2019-07-14 (×11): qty 1000

## 2019-07-14 MED ORDER — FENTANYL CITRATE (PF) 2500 MCG/50ML IJ SOLN
25.0000 ug/h | Status: AC
  Administered 2019-07-14: 400 ug/h via INTRAVENOUS
  Administered 2019-07-15: 300 ug/h via INTRAVENOUS
  Administered 2019-07-15: 23:00:00 325 ug/h via INTRAVENOUS
  Administered 2019-07-16 – 2019-07-17 (×2): 350 ug/h via INTRAVENOUS
  Filled 2019-07-14 (×6): qty 100

## 2019-07-14 MED ORDER — VECURONIUM BROMIDE 10 MG IV SOLR
10.0000 mg | INTRAVENOUS | Status: DC | PRN
  Administered 2019-07-14 – 2019-07-23 (×18): 10 mg via INTRAVENOUS
  Filled 2019-07-14 (×18): qty 10

## 2019-07-14 MED ORDER — PRO-STAT SUGAR FREE PO LIQD
30.0000 mL | Freq: Every day | ORAL | Status: DC
  Administered 2019-07-14 – 2019-07-18 (×22): 30 mL
  Filled 2019-07-14 (×20): qty 30

## 2019-07-14 MED ORDER — NOREPINEPHRINE 4 MG/250ML-% IV SOLN
0.0000 ug/min | INTRAVENOUS | Status: DC
  Administered 2019-07-14 – 2019-07-15 (×2): 4 ug/min via INTRAVENOUS
  Administered 2019-07-16: 14:00:00 5 ug/min via INTRAVENOUS
  Filled 2019-07-14 (×4): qty 250

## 2019-07-14 MED ORDER — NOREPINEPHRINE 4 MG/250ML-% IV SOLN
INTRAVENOUS | Status: AC
  Filled 2019-07-14: qty 250

## 2019-07-14 MED ORDER — SODIUM CHLORIDE 0.9 % IV SOLN
2.0000 g | INTRAVENOUS | Status: DC
  Administered 2019-07-14 – 2019-07-21 (×8): 2 g via INTRAVENOUS
  Filled 2019-07-14 (×8): qty 20

## 2019-07-14 MED ORDER — FUROSEMIDE 10 MG/ML IJ SOLN
40.0000 mg | Freq: Once | INTRAMUSCULAR | Status: AC
  Administered 2019-07-14: 11:00:00 40 mg via INTRAVENOUS
  Filled 2019-07-14: qty 4

## 2019-07-14 MED ORDER — SODIUM CHLORIDE 0.9 % IV SOLN
2.0000 g | Freq: Four times a day (QID) | INTRAVENOUS | Status: DC
  Administered 2019-07-14 – 2019-07-22 (×32): 2 g via INTRAVENOUS
  Filled 2019-07-14: qty 2
  Filled 2019-07-14: qty 2000
  Filled 2019-07-14: qty 2
  Filled 2019-07-14 (×3): qty 2000
  Filled 2019-07-14 (×3): qty 2
  Filled 2019-07-14: qty 2000
  Filled 2019-07-14: qty 2
  Filled 2019-07-14: qty 2000
  Filled 2019-07-14 (×2): qty 2
  Filled 2019-07-14: qty 2000
  Filled 2019-07-14 (×2): qty 2
  Filled 2019-07-14: qty 2000
  Filled 2019-07-14: qty 2
  Filled 2019-07-14 (×5): qty 2000
  Filled 2019-07-14: qty 2
  Filled 2019-07-14 (×8): qty 2000
  Filled 2019-07-14: qty 2

## 2019-07-14 MED ORDER — ARTIFICIAL TEARS OPHTHALMIC OINT
1.0000 "application " | TOPICAL_OINTMENT | Freq: Three times a day (TID) | OPHTHALMIC | Status: DC
  Administered 2019-07-14 – 2019-07-23 (×28): 1 via OPHTHALMIC
  Filled 2019-07-14 (×3): qty 3.5

## 2019-07-14 NOTE — Progress Notes (Signed)
eLink Physician-Brief Progress Note Patient Name: Bruce Weaver DOB: 1940-05-29 MRN: [id number]   Date of Service  07/14/2019  HPI/Events of Note  Patient with metabolic syndrome and severe COVID 19 pneumonia transferred from [facility] to [facility] ICU for closer monitoring and care.  eICU Interventions  New Patient Evaluation completed.        Bruce Weaver 07/14/2019, 5:55 AM

## 2019-07-14 NOTE — Progress Notes (Addendum)
Report called to 75M @ [facility]. Report given to [facility] and awaiting pickup. Daughter, Olevia Bowens, answered home phone and updated on patients placement.

## 2019-07-14 NOTE — Progress Notes (Signed)
Brief Progress Note I have called the patient's wife and updated her on the patient's status. I explained that we have started Mr. Smet on new antibiotics, we were giving him a diuretic  and that we were going to be proning him based on his PF ratio. She verbalized understanding and had no further questions at the completion of the call . I explained that we would continue to update her.   Magdalen Spatz, MSN, AGACNP-BC [place] Pager # [phone] After 4 pm please call [phone] 07/14/2019 11:11 AM

## 2019-07-14 NOTE — Progress Notes (Signed)
Personal belongings including clothing and dentures sent to Bruce Weaver from [place] via [employer].

## 2019-07-14 NOTE — Progress Notes (Signed)
eLink Physician-Brief Progress Note Patient Name: ANAND CHARITY DOB: [birth year] MRN: [id number]   Date of Service  07/14/2019  HPI/Events of Note  Diarrhea, patient receiving 200 ml free water via feeding tube in the stomach,  while he is being proned, serum Na+ 144  eICU Interventions  Flexiseal ordered, Free water discontinued.        Kerry Kass Thomas Rhude 07/14/2019, 8:50 PM

## 2019-07-14 NOTE — Progress Notes (Signed)
Patient has had a significant increase in secretions that appear the color of his tube feed. RT, Jessica, and RN discussed xray for verification of feeding tube placement to ensure the tube is in proper location. External length of tube is verified to be 55 cm from nare. Order for xray placed. RN will continue to monitor closely.

## 2019-07-14 NOTE — Progress Notes (Signed)
Pt proned at this time with no complications. ETT secured with cloth tape  In proper position. VS within normal limits. Head turned to right with right arm in swimmers position.

## 2019-07-14 NOTE — Progress Notes (Signed)
Patient's head rotated at this time with no complications.

## 2019-07-14 NOTE — Progress Notes (Signed)
Nutrition Follow-up  DOCUMENTATION CODES:   Not applicable  INTERVENTION:  Vital 1.5 cal via NG tube @ 50m/hr (10850m  3025mro-stat x6 daily  Free water flushes per MD, currently 200m58m (1200ml39mF regimen and propofol at current rate providing 2668 total kcal/day (100 % of kcal needs), 163 grams of protein (100% of protein needs), 825 ml free water (2025ml 34ml with tube feeding)   NUTRITION DIAGNOSIS:   Increased nutrient needs related to acute illness(COVID-19 infection) as evidenced by estimated needs.  Ongoing.  GOAL:   Patient will meet greater than or equal to 90% of their needs  Met with tube feeding.   MONITOR:   Vent status, TF tolerance, Labs, Weight trends  REASON FOR ASSESSMENT:   Ventilator, Consult Enteral/tube feeding initiation and management  ASSESSMENT:   78 y.o94male with medical history of type 2 DM, HTN, and HLD. Patient received the first dose of COVID vaccine on 2/25. He presented to the ED with generalized weakness and fatigue which have been worsening. [facility] and at that time was found to have multifocal PNA and he tested positive for COVID-19 on 3/2 and was started on remdesivir and steroids. CT chest negative for PE, indicated COVID-19 PNA. [facility]  3/2 - diagnosed with COVID 3/8 - NG placed  3/10 - pt intubated 3/13 - partial dislodgment of pt's arterial line 3/16 - [facility]   [facility]   Pt to start proning today.  Current tube feeding regimen: 60ml P104mtat BID, 200ml fr26mater Q4, Vital High Protein @ 45ml/hr 17mient is currently intubated on ventilator support MV: 12.4 L/min Temp (24hrs), Avg:99.1 F (37.3 C), Min:98.4 F (36.9 C), Max:99.7 F (37.6 C)  Propofol: 17 ml/hr, provides 448 kcals  Medications reviewed and include: Colace, SSI, Levemir, Miralax, pepcid Drips: Fentanyl,  Levophed, Propofol  Labs reviewed. CBGs 113-293  UOP: 1,425ml x24 79ms I/O: + 4,955.1ml since 68mit  NUTRITION - FOCUSED PHYSICAL EXAM:  RD unable to perform at this time.    Diet Order:   Diet Order    None      EDUCATION NEEDS:   No education needs have been identified at this time  Skin:  Skin Assessment: Skin Integrity Issues: Skin Integrity Issues:: Other (Comment) Other: MASD groin  Last BM:  3/10  Height:   Ht Readings from Last 1 Encounters:  07/14/19 6' (1.829 m)    Weight:   Wt Readings from Last 1 Encounters:  07/14/19 124.3 kg    BMI:  Body mass index is 37.17 kg/m.  Estimated Nutritional Needs:   Kcal:  2642-3294  0947-0962 150-185 grams  Fluid:  >/= 2L/d   Yamili Lichtenwalner AverLarkin InaDN RD pager number and weekend/on-call pager number located in [email or web address]

## 2019-07-14 NOTE — Progress Notes (Addendum)
NAME:  Bruce Weaver, MRN:  [id number], DOB:  1940-10-25, LOS: 70 ADMISSION DATE:  07/29/2019, CONSULTATION DATE:  07/05/19 REFERRING MD:  Lonny Prude, CHIEF COMPLAINT:  SOB   Brief History   79 year old man with metabolic syndrome presenting with severe COVID pneumonia.  History of present illness   79 year old man with metabolic syndrome presenting with severe COVID pneumonia.  Transferred from [place].  Diagnosed on 3/2.  Progressive hypoxemia and delirium prompting transfer to [facility]  In stepdown with higher O2 needs so PCCM consulted. Patient is AO to self only.    Past Medical History  DM2 HTN HLD Obesity  Significant Hospital Events   3/1 admitted to [facility] 3/2 diagnosed with COVID 3/5 4L to 15L, transferred to [facility] 3/6 PCCM consult 07/07/2019 required intubation 07/07/2019 starting proning at 1600 hrs. 3/13 partial dislodgment of his arterial line-did have moderate bleeding 3/15 Transfer from [place]:  PCCM  Procedures:  07/07/2019 intubation>> 07/08/2018 right CVL>>  Significant Diagnostic Tests:  CXR- multifocal pneumonia CTA OSH neg for PE  Micro Data:  CRP low D dimer pretty low Pct neg 07/09/2019 blood cultures x2>>No growth 07/09/2019 sputum culture>>Enterobacter FAECALIS , ENTEROBACTER AEROGENES  07/09/2018 1 repeat procalcitonin>> 3/11 Urine>> No Growth  Antimicrobials:  Remdesivir completed  Interim history/subjective:  Transferred to [facility] from [facility]  3/15 Fio2 decreased to 70% from 80 % 3/16 am  Awakens to minimal stimulation + 5400 cc's  Objective   Blood pressure (!) 144/56, pulse 84, temperature 99.5 F (37.5 C), resp. rate (!) 25, height 6' (1.829 m), weight 124.3 kg, SpO2 97 %.    Vent Mode: PRVC FiO2 (%):  [70 %-100 %] 70 % Set Rate:  [26 bmp-28 bmp] 28 bmp Vt Set:  [460 mL] 460 mL PEEP:  [10 cmH20] 10 cmH20 Plateau Pressure:  [20 cmH20-25 cmH20] 22 cmH20   Intake/Output Summary (Last 24 hours) at 07/14/2019 0845 Last data filed at  07/14/2019 0800 Gross per 24 hour  Intake 2754.47 ml  Output 1550 ml  Net 1204.47 ml   Filed Weights   07/12/19 0500 07/13/19 0500 07/14/19 0423  Weight: 120.5 kg 121.5 kg 124.3 kg    Examination: General appearance: 79 y.o., male, critically ill, intubated on life support  Eyes: anicteric sclerae, moist conjunctivae;  HENT: NCAT; ETT in place, Feeding Tube in place , No LAD Neck: Trachea midline, No JVD Lungs: Bilateral chest excursion,  mechanically ventilated breath sounds, diminished per bases  CV: RRR, S1, S2 Abdomen: Soft, non-tender; non-distended,BS + Extremities: No peripheral edema, radial and DP pulses present bilaterally  Skin: Normal temperature, turgor and texture; no rash, no lesions Psych: sedated and intubated Neuro: Alert to voice, he will open his eyes, sedated and intubated    Assessment & Plan:    Acute hypoxemic respiratory failure secondary to Covid pneumonia Post remdesivir, remains on decadron CXR 3/15>> peripheral and lower lung predominant interstitial opacities, consistent with COVID-19 pneumonia Plan Continue mechanical ventilation per ARDS protocol Target TVol 6-8cc/kgIBW Target Plateau Pressure < 30cm H20 Target driving pressure less than 15 cm of water Target PaO2 55-65: titrate PEEP/FiO2 per protocol As long as PaO2 to FiO2 ratio is less than 1:150 position in prone position for 16 hours a day Ventilator associated pneumonia prevention protocol. ABG prn 3/15: weaning Fio2, PF 75, on 80%, DP 14, in PRVC, comfortable an passive on vent with fent, he will open eyes to voice.  3/16: No ABG, Weaned to 70% from 80%, Will  check ABG  Sputum + for Enterobacter FAECALIS , ENTEROBACTER AEROGENES  Continued high FiO2 requirements and Temp of 99.9 No leukocytosis Plan Will initiate antibiotic therapy ( Ampicillin and Rocephin ) to cover for both gram - and gram + growth Trend fever curve, and WBC   Multifactorial shock with sedation and ARDS -  no longer on pressors  Acute Metabolic Encephalopathy - related to sedation  - improved, we will continue to observe closely  - related to above   Diabetes Mellitus II, with Hyperglycemia, secondary to steroids  - levemir + SSI  - Goal CBGs 140-180  Constipation - scheduled bowel regimen   Hypernatremia, insensible losses  - increase FW   Anemia, multifactorial, related to critical illness and blood draws  - continue to observe   Best practice:  Diet: Tube feeds per protocol Pain/Anxiety/Delirium protocol (if indicated): Current sedation for tube tolerance VAP protocol (if indicated): In place DVT prophylaxis: 0.5 mg/kg lovenox BID GI prophylaxis: would do pepcid while on AC and steroids Glucose control: Sliding scale insulin protocol Mobility: BR Code Status: Full Family Communication: we will up date family today  Disposition:  ICU   CC APP time: 69 minutes  Magdalen Spatz, MSN, AGACNP-BC [place] Pager # [phone] After 4 pm please call [phone]  07/14/2019 8:46 AM

## 2019-07-15 ENCOUNTER — Inpatient Hospital Stay (HOSPITAL_COMMUNITY): Payer: Medicare HMO

## 2019-07-15 LAB — CBC
HCT: 32.6 % — ABNORMAL LOW (ref 39.0–52.0)
Hemoglobin: 9.9 g/dL — ABNORMAL LOW (ref 13.0–17.0)
MCH: 30.6 pg (ref 26.0–34.0)
MCHC: 30.4 g/dL (ref 30.0–36.0)
MCV: 100.6 fL — ABNORMAL HIGH (ref 80.0–100.0)
Platelets: 210 10*3/uL (ref 150–400)
RBC: 3.24 MIL/uL — ABNORMAL LOW (ref 4.22–5.81)
RDW: 14.3 % (ref 11.5–15.5)
WBC: 7.9 10*3/uL (ref 4.0–10.5)
nRBC: 0 % (ref 0.0–0.2)

## 2019-07-15 LAB — POCT I-STAT 7, (LYTES, BLD GAS, ICA,H+H)
Acid-Base Excess: 9 mmol/L — ABNORMAL HIGH (ref 0.0–2.0)
Acid-Base Excess: 10 mmol/L — ABNORMAL HIGH (ref 0.0–2.0)
Bicarbonate: 36.5 mmol/L — ABNORMAL HIGH (ref 20.0–28.0)
Bicarbonate: 38 mmol/L — ABNORMAL HIGH (ref 20.0–28.0)
Calcium, Ion: 1.26 mmol/L (ref 1.15–1.40)
Calcium, Ion: 1.26 mmol/L (ref 1.15–1.40)
HCT: 29 % — ABNORMAL LOW (ref 39.0–52.0)
HCT: 31 % — ABNORMAL LOW (ref 39.0–52.0)
Hemoglobin: 9.9 g/dL — ABNORMAL LOW (ref 13.0–17.0)
Hemoglobin: 10.5 g/dL — ABNORMAL LOW (ref 13.0–17.0)
O2 Saturation: 89 %
O2 Saturation: 85 %
Patient temperature: 98
Patient temperature: 37
Potassium: 4.2 mmol/L (ref 3.5–5.1)
Potassium: 3.9 mmol/L (ref 3.5–5.1)
Sodium: 141 mmol/L (ref 135–145)
Sodium: 141 mmol/L (ref 135–145)
TCO2: 38 mmol/L — ABNORMAL HIGH (ref 22–32)
TCO2: 40 mmol/L — ABNORMAL HIGH (ref 22–32)
pCO2 arterial: 62.7 mmHg — ABNORMAL HIGH (ref 32.0–48.0)
pCO2 arterial: 68.8 mmHg (ref 32.0–48.0)
pH, Arterial: 7.372 (ref 7.350–7.450)
pH, Arterial: 7.35 (ref 7.350–7.450)
pO2, Arterial: 59 mmHg — ABNORMAL LOW (ref 83.0–108.0)
pO2, Arterial: 55 mmHg — ABNORMAL LOW (ref 83.0–108.0)

## 2019-07-15 LAB — COMPREHENSIVE METABOLIC PANEL
ALT: 19 U/L (ref 0–44)
AST: 19 U/L (ref 15–41)
Albumin: 1.6 g/dL — ABNORMAL LOW (ref 3.5–5.0)
Alkaline Phosphatase: 48 U/L (ref 38–126)
Anion gap: 12 (ref 5–15)
BUN: 59 mg/dL — ABNORMAL HIGH (ref 8–23)
CO2: 33 mmol/L — ABNORMAL HIGH (ref 22–32)
Calcium: 8.8 mg/dL — ABNORMAL LOW (ref 8.9–10.3)
Chloride: 98 mmol/L (ref 98–111)
Creatinine, Ser: 0.94 mg/dL (ref 0.61–1.24)
GFR calc Af Amer: >60 mL/min (ref >60)
GFR calc non Af Amer: >60 mL/min (ref >60)
Glucose, Bld: 308 mg/dL — ABNORMAL HIGH (ref 70–99)
Potassium: 4.2 mmol/L (ref 3.5–5.1)
Sodium: 143 mmol/L (ref 135–145)
Total Bilirubin: 0.4 mg/dL (ref 0.3–1.2)
Total Protein: 5.5 g/dL — ABNORMAL LOW (ref 6.5–8.1)

## 2019-07-15 LAB — GLUCOSE, CAPILLARY
Glucose-Capillary: 247 mg/dL — ABNORMAL HIGH (ref 70–99)
Glucose-Capillary: 213 mg/dL — ABNORMAL HIGH (ref 70–99)
Glucose-Capillary: 305 mg/dL — ABNORMAL HIGH (ref 70–99)
Glucose-Capillary: 284 mg/dL — ABNORMAL HIGH (ref 70–99)
Glucose-Capillary: 242 mg/dL — ABNORMAL HIGH (ref 70–99)
Glucose-Capillary: 177 mg/dL — ABNORMAL HIGH (ref 70–99)

## 2019-07-15 LAB — C-REACTIVE PROTEIN: CRP: 11.3 mg/dL — ABNORMAL HIGH (ref <1.0)

## 2019-07-15 LAB — D-DIMER, QUANTITATIVE: D-Dimer, Quant: 1.56 ug/mL-FEU — ABNORMAL HIGH (ref 0.00–0.50)

## 2019-07-15 MED ORDER — FUROSEMIDE 10 MG/ML IJ SOLN
40.0000 mg | Freq: Two times a day (BID) | INTRAMUSCULAR | Status: DC
  Administered 2019-07-15 – 2019-07-16 (×3): 40 mg via INTRAVENOUS
  Filled 2019-07-15 (×3): qty 4

## 2019-07-15 MED ORDER — INSULIN ASPART 100 UNIT/ML ~~LOC~~ SOLN
4.0000 [IU] | SUBCUTANEOUS | Status: DC
  Administered 2019-07-15 – 2019-07-16 (×6): 4 [IU] via SUBCUTANEOUS

## 2019-07-15 NOTE — Progress Notes (Signed)
Inpatient Diabetes Program Recommendations  AACE/ADA: New Consensus Statement on Inpatient Glycemic Control   Target Ranges:  Prepandial:   less than 140 mg/dL      Peak postprandial:   less than 180 mg/dL (1-2 hours)      Critically ill patients:  140 - 180 mg/dL   Results for DIAMOND, OLIVARES (MRN [id number]) as of 07/15/2019 11:16  Ref. Range 07/14/2019 00:07 07/14/2019 03:30 07/14/2019 07:48 07/14/2019 11:41 07/14/2019 16:30 07/14/2019 19:35 07/14/2019 23:30 07/15/2019 03:38 07/15/2019 07:57  Glucose-Capillary Latest Ref Range: 70 - 99 mg/dL 258 (H) 179 (H) 113 (H) 171 (H) 210 (H) 174 (H) 195 (H) 247 (H) 213 (H)   Review of Glycemic Control  Current orders for Inpatient glycemic control: Levemir 21 units BID, Novolog 0-15 units Q4H; Vital @ 45 ml/hr  Inpatient Diabetes Program Recommendations:   Insulin-Tube Feeding Coverage: Please consider ordering Novolog 4 units Q4H for tube feeding coverage. If tube feeding is stopped or held then Novolog tube feeding coverage should also be stopped or held.  Thanks, Barnie Alderman, RN, MSN, CDE Diabetes Coordinator Inpatient Diabetes Program [phone] (Team Pager from 8am to 5pm)

## 2019-07-15 NOTE — Progress Notes (Signed)
Patient's head rotated at this time with no complications.

## 2019-07-15 NOTE — Progress Notes (Signed)
RT resecured ETT with cloth tape before proning. Patient proned at this time without complications.

## 2019-07-15 NOTE — Progress Notes (Signed)
eLink Physician-Brief Progress Note Patient Name: Bruce Weaver DOB: 21-Jul-1940 MRN: [id number]   Date of Service  07/15/2019  HPI/Events of Note  Pt proned at 20:30 p.m.  eICU Interventions  ABG ordered for 21:30 p.m., a.m. CBC, BMET, Mg ordered.        Kerry Kass [place] 07/15/2019, 8:29 PM

## 2019-07-15 NOTE — Progress Notes (Addendum)
NAME:  Bruce Weaver, MRN:  [id number], DOB:  October 09, 1940, LOS: 54 ADMISSION DATE:  07/25/2019, CONSULTATION DATE:  07/05/19 REFERRING MD:  Lonny Prude, CHIEF COMPLAINT:  SOB   Brief History   79 year old man with metabolic syndrome presenting with severe COVID pneumonia.  History of present illness   79 year old man with metabolic syndrome presenting with severe COVID pneumonia.  Transferred from [place].  Diagnosed on 3/2.  Progressive hypoxemia and delirium prompting transfer to [facility].  In stepdown with higher O2 needs so PCCM consulted. Patient is AO to self only.    Past Medical History  DM2 HTN HLD Obesity  Significant Hospital Events   3/1 admitted to [facility] 3/2 diagnosed with COVID 3/5 4L to 15L, transferred to [facility] 3/6 PCCM consult 07/07/2019 required intubation 07/07/2019 starting proning at 1600 hrs. 3/13 partial dislodgment of his arterial line-did have moderate bleeding 3/15 Transfer from [place]:  PCCM  Procedures:  07/07/2019 intubation>> 07/08/2018 right CVL>>  Significant Diagnostic Tests:  CXR- multifocal pneumonia CTA OSH neg for PE  Micro Data:  CRP low D dimer pretty low Pct neg 07/09/2019 blood cultures x2>>No growth 07/09/2019 sputum culture>>Enterobacter FAECALIS , ENTEROBACTER AEROGENES  07/09/2018 1 repeat procalcitonin>> 3/11 Urine>> No Growth  Antimicrobials:  Remdesivir completed  Interim history/subjective:   Proned yesterday.  Has remained hemodynamically stable.  Objective   Blood pressure (!) 105/35, pulse 76, temperature 98.8 F (37.1 C), resp. rate (!) 26, height 6' (1.829 m), weight 122.8 kg, SpO2 100 %.    Vent Mode: PRVC FiO2 (%):  [60 %-100 %] 100 % Set Rate:  [26 bmp] 26 bmp Vt Set:  [460 mL] 460 mL PEEP:  [10 cmH20] 10 cmH20 Plateau Pressure:  [22 cmH20-26 cmH20] 22 cmH20   Intake/Output Summary (Last 24 hours) at 07/15/2019 1139 Last data filed at 07/15/2019 1100 Gross per 24 hour  Intake 2282.02 ml  Output 2565 ml    Net -282.98 ml   Filed Weights   07/13/19 0500 07/14/19 0423 07/15/19 0432  Weight: 121.5 kg 124.3 kg 122.8 kg    Examination: Gen:      No acute distress HEENT:  EOMI, sclera anicteric, ETT Neck:     No masses; no thyromegaly Lungs:    Clear to auscultation bilaterally; normal respiratory effort CV:         Regular rate and rhythm; no murmurs Abd:      + bowel sounds; soft, non-tender; no palpable masses, no distension Ext:    No edema; adequate peripheral perfusion Skin:      Warm and dry; no rash Neuro: Sedated, unresponsive  Assessment & Plan:    Acute hypoxemic respiratory failure secondary to Covid pneumonia Post remdesivir, decadron Plan Continue proning as P/F ratio is low Continue low TV ventilation Follow CXR Repeat lasix dose  Sputum + for Enterobacter FAECALIS , ENTEROBACTER AEROGENES  Continued high FiO2 requirements and Temp of 99.9 No leukocytosis Plan Continue ampicillin, ceftriaxone  Multifactorial shock with sedation and ARDS Off pressors  Acute Metabolic Encephalopathy Related to sedation  Monitor  Diabetes Mellitus II, with Hyperglycemia, secondary to steroids  Off steroids yesterday.  Hopefully her sugars will improve Continue Levemir, SSI Goal CBGs 140-180  Anemia, multifactorial, related to critical illness and blood draws  Continue to observe  Best practice:  Diet: Tube feeds per protocol Pain/Anxiety/Delirium protocol (if indicated): Current sedation for tube tolerance VAP protocol (if indicated): In place DVT prophylaxis: 0.5 mg/kg lovenox BID GI prophylaxis: would  do pepcid while on AC and steroids Glucose control: Sliding scale insulin protocol Mobility: BR Code Status: Full Family Communication: wife updated 3/17 Disposition:  ICU   The patient is critically ill with multiple organ system failure and requires high complexity decision making for assessment and support, frequent evaluation and titration of therapies, advanced  monitoring, review of radiographic studies and interpretation of complex data.   Critical Care Time devoted to patient care services, exclusive of separately billable procedures, described in this note is 35 minutes.   Marshell Garfinkel MD [facility] Pulmonary and Critical Care Please see [email or web address] for pager details.  07/15/2019, 11:39 AM

## 2019-07-15 NOTE — Progress Notes (Signed)
eLink Physician-Brief Progress Note Patient Name: Bruce Weaver DOB: 06-Mar-1941 MRN: [id number]   Date of Service  07/15/2019  HPI/Events of Note  Pt was proned at 8:30 p.m. tonight and had a post-proning ABG which I have reviewed.  eICU Interventions  No new intervention at this time.        Frederik Pear 07/15/2019, 9:47 PM

## 2019-07-15 NOTE — Progress Notes (Signed)
Patients head rotated at this time with RN at bedside. No complications noted.

## 2019-07-15 NOTE — Progress Notes (Signed)
Patient unproned at this time per MD without complication. Multiple RN's at beside. ETT tube is still secure at 24 at the lip.

## 2019-07-16 LAB — BASIC METABOLIC PANEL
Anion gap: 10 (ref 5–15)
BUN: 57 mg/dL — ABNORMAL HIGH (ref 8–23)
CO2: 33 mmol/L — ABNORMAL HIGH (ref 22–32)
Calcium: 8.7 mg/dL — ABNORMAL LOW (ref 8.9–10.3)
Chloride: 99 mmol/L (ref 98–111)
Creatinine, Ser: 1.05 mg/dL (ref 0.61–1.24)
GFR calc Af Amer: >60 mL/min (ref >60)
GFR calc non Af Amer: >60 mL/min (ref >60)
Glucose, Bld: 211 mg/dL — ABNORMAL HIGH (ref 70–99)
Potassium: 4.3 mmol/L (ref 3.5–5.1)
Sodium: 142 mmol/L (ref 135–145)

## 2019-07-16 LAB — CBC WITH DIFFERENTIAL/PLATELET
Abs Immature Granulocytes: 0.19 10*3/uL — ABNORMAL HIGH (ref 0.00–0.07)
Basophils Absolute: 0.1 10*3/uL (ref 0.0–0.1)
Basophils Relative: 1 %
Eosinophils Absolute: 0.3 10*3/uL (ref 0.0–0.5)
Eosinophils Relative: 3 %
HCT: 32.9 % — ABNORMAL LOW (ref 39.0–52.0)
Hemoglobin: 10.1 g/dL — ABNORMAL LOW (ref 13.0–17.0)
Immature Granulocytes: 2 %
Lymphocytes Relative: 6 %
Lymphs Abs: 0.7 10*3/uL (ref 0.7–4.0)
MCH: 31 pg (ref 26.0–34.0)
MCHC: 30.7 g/dL (ref 30.0–36.0)
MCV: 100.9 fL — ABNORMAL HIGH (ref 80.0–100.0)
Monocytes Absolute: 0.7 10*3/uL (ref 0.1–1.0)
Monocytes Relative: 6 %
Neutro Abs: 8.7 10*3/uL — ABNORMAL HIGH (ref 1.7–7.7)
Neutrophils Relative %: 82 %
Platelets: 230 10*3/uL (ref 150–400)
RBC: 3.26 MIL/uL — ABNORMAL LOW (ref 4.22–5.81)
RDW: 14.5 % (ref 11.5–15.5)
WBC: 10.5 10*3/uL (ref 4.0–10.5)
nRBC: 0.3 % — ABNORMAL HIGH (ref 0.0–0.2)

## 2019-07-16 LAB — GLUCOSE, CAPILLARY
Glucose-Capillary: 169 mg/dL — ABNORMAL HIGH (ref 70–99)
Glucose-Capillary: 169 mg/dL — ABNORMAL HIGH (ref 70–99)
Glucose-Capillary: 175 mg/dL — ABNORMAL HIGH (ref 70–99)
Glucose-Capillary: 181 mg/dL — ABNORMAL HIGH (ref 70–99)
Glucose-Capillary: 193 mg/dL — ABNORMAL HIGH (ref 70–99)
Glucose-Capillary: 197 mg/dL — ABNORMAL HIGH (ref 70–99)
Glucose-Capillary: 232 mg/dL — ABNORMAL HIGH (ref 70–99)

## 2019-07-16 LAB — POCT I-STAT 7, (LYTES, BLD GAS, ICA,H+H)
Acid-Base Excess: 11 mmol/L — ABNORMAL HIGH (ref 0.0–2.0)
Acid-Base Excess: 11 mmol/L — ABNORMAL HIGH (ref 0.0–2.0)
Bicarbonate: 38.9 mmol/L — ABNORMAL HIGH (ref 20.0–28.0)
Bicarbonate: 37.7 mmol/L — ABNORMAL HIGH (ref 20.0–28.0)
Calcium, Ion: 1.22 mmol/L (ref 1.15–1.40)
Calcium, Ion: 1.22 mmol/L (ref 1.15–1.40)
HCT: 30 % — ABNORMAL LOW (ref 39.0–52.0)
HCT: 27 % — ABNORMAL LOW (ref 39.0–52.0)
Hemoglobin: 10.2 g/dL — ABNORMAL LOW (ref 13.0–17.0)
Hemoglobin: 9.2 g/dL — ABNORMAL LOW (ref 13.0–17.0)
O2 Saturation: 90 %
O2 Saturation: 85 %
Patient temperature: 37.2
Patient temperature: 96.8
Potassium: 4.2 mmol/L (ref 3.5–5.1)
Potassium: 3.7 mmol/L (ref 3.5–5.1)
Sodium: 141 mmol/L (ref 135–145)
Sodium: 143 mmol/L (ref 135–145)
TCO2: 41 mmol/L — ABNORMAL HIGH (ref 22–32)
TCO2: 40 mmol/L — ABNORMAL HIGH (ref 22–32)
pCO2 arterial: 69.1 mmHg (ref 32.0–48.0)
pCO2 arterial: 63.2 mmHg — ABNORMAL HIGH (ref 32.0–48.0)
pH, Arterial: 7.36 (ref 7.350–7.450)
pH, Arterial: 7.379 (ref 7.350–7.450)
pO2, Arterial: 65 mmHg — ABNORMAL LOW (ref 83.0–108.0)
pO2, Arterial: 51 mmHg — ABNORMAL LOW (ref 83.0–108.0)

## 2019-07-16 LAB — MAGNESIUM: Magnesium: 2.1 mg/dL (ref 1.7–2.4)

## 2019-07-16 MED ORDER — INSULIN ASPART 100 UNIT/ML ~~LOC~~ SOLN
8.0000 [IU] | SUBCUTANEOUS | Status: DC
  Administered 2019-07-16 – 2019-07-17 (×5): 8 [IU] via SUBCUTANEOUS

## 2019-07-16 MED ORDER — FUROSEMIDE 10 MG/ML IJ SOLN
40.0000 mg | Freq: Three times a day (TID) | INTRAMUSCULAR | Status: DC
  Administered 2019-07-16 – 2019-07-17 (×3): 40 mg via INTRAVENOUS
  Filled 2019-07-16 (×3): qty 4

## 2019-07-16 MED ORDER — FAMOTIDINE 40 MG/5ML PO SUSR
20.0000 mg | Freq: Every day | ORAL | Status: DC
  Administered 2019-07-17 – 2019-07-23 (×7): 20 mg
  Filled 2019-07-16 (×7): qty 2.5

## 2019-07-16 NOTE — Progress Notes (Signed)
Patient's head rotated at this time without event.

## 2019-07-16 NOTE — Progress Notes (Signed)
PT rotated from supine to prone position with no complications will continue to monitor.

## 2019-07-16 NOTE — Progress Notes (Signed)
Patient's head rotated at this time with no complications.

## 2019-07-16 NOTE — Progress Notes (Signed)
ANTICOAGULATION CONSULT NOTE - Follow Up Consult  Pharmacy Consult for Lovenox Indication: VTE prophylaxis  No Known Allergies  Patient Measurements: Height: 6' (182.9 cm) Weight: 264 lb 12.4 oz (120.1 kg) IBW/kg (Calculated) : 77.6  Vital Signs: Temp: 98.4 F (36.9 C) (03/18 0730) Temp Source: Bladder (03/18 0400) BP: 132/49 (03/18 0730) Pulse Rate: 80 (03/18 0730)  Labs: Recent Labs    07/14/19 0313 07/14/19 0918 07/15/19 0424 07/15/19 0424 07/15/19 2124 07/16/19 0344  HGB 10.0*   < > 9.9*   < > 10.5* 10.1*  10.2*  HCT 33.6*   < > 32.6*  --  31.0* 32.9*  30.0*  PLT 203  --  210  --   --  230  CREATININE 1.06  --  0.94  --   --  1.05   < > = values in this interval not displayed.    Estimated Creatinine Clearance: 78.8 mL/min (by C-G formula based on SCr of 1.05 mg/dL).   Assessment: 27 yoM admitted on 3/6 with COVID-19 pneumonia.  Pharmacy helping dose Lovenox for VTE prophylaxis.  H/H remains low stable. Plt wnl. SCr wnl   Goal of Therapy:  Monitor platelets by anticoagulation protocol: Yes   Plan:  Continue Lovenox 0.5 mg/kg (60mg ) Francis q12h Monitor CBC and renal function, adjust as needed  Albertina Parr, PharmD., BCPS Clinical Pharmacist Clinical phone for 07/16/19 until 3:30pm: [phone] If after 3:30pm, please refer to [facility] for unit-specific pharmacist

## 2019-07-16 NOTE — Progress Notes (Signed)
NAME:  Bruce Weaver, MRN:  [id number], DOB:  [birth year], LOS: 12 ADMISSION DATE:  07/20/2019, CONSULTATION DATE:  07/05/19 REFERRING MD:  Lonny Prude, CHIEF COMPLAINT:  SOB   Brief History   79 year old man with metabolic syndrome presenting with severe COVID pneumonia.  Transferred from [place].  Diagnosed on 3/2.  Progressive hypoxemia and delirium [facility] and ultimately intubation for ARDS.   Past Medical History  DM2 HTN HLD Obesity  Significant Hospital Events   3/1 admitted to [facility] 3/2 diagnosed with COVID 3/5 4L to 15L, transferred to [facility] 3/6 PCCM consult 07/07/2019 required intubation 07/07/2019 starting proning at 1600 hrs. 3/13 partial dislodgment of his arterial line-did have moderate bleeding 3/15 Transfer from [place] to [place], resumed proning  Consults:  PCCM  Procedures:  07/07/2019 intubation>> 07/08/2018 right CVL>>  Significant Diagnostic Tests:  CXR- multifocal pneumonia CTA OSH neg for PE  Micro Data:  CRP low D dimer pretty low Pct neg 07/09/2019 blood cultures x2>>No growth 07/09/2019 sputum culture>>Enterobacter FAECALIS , ENTEROBACTER AEROGENES  07/09/2018 1 repeat procalcitonin>> 3/11 Urine>> No Growth  Antimicrobials:  Remdesivir completed Ampicillin 3/16 >> Ceftriaxone 3/16 >>  Interim history/subjective:   Remains proned. No acute events overnight.  Objective   Blood pressure (!) 122/43, pulse 83, temperature 98.4 F (36.9 C), resp. rate (!) 28, height 6' (1.829 m), weight 120.1 kg, SpO2 94 %.    Vent Mode: PRVC FiO2 (%):  [100 %] 100 % Set Rate:  [26 bmp-28 bmp] 28 bmp Vt Set:  [460 mL] 460 mL PEEP:  [10 cmH20-12 cmH20] 12 cmH20 Plateau Pressure:  [21 cmH20-29 cmH20] 29 cmH20   Intake/Output Summary (Last 24 hours) at 07/16/2019 [id number] Last data filed at 07/16/2019 [id number] Gross per 24 hour  Intake 2604.85 ml  Output 2475 ml  Net 129.85 ml   Filed Weights   07/14/19 0423 07/15/19 0432 07/16/19 0500  Weight: 124.3 kg  122.8 kg 120.1 kg    Examination: Gen:      No acute distress HEENT:  EOMI, sclera anicteric, ETT Neck:     No masses; no thyromegaly Lungs:    Clear to auscultation bilaterally; normal respiratory effort CV:         Regular rate and rhythm; no murmurs Abd:      + bowel sounds; soft, non-tender; no palpable masses, no distension Ext:    No edema; adequate peripheral perfusion Skin:      Warm and dry; no rash Neuro: Sedated, unresponsive  Lab significant for glucose 211, creatinine 1.05, WBC 10.4, hemoglobin 10.1 No new imaging  Assessment & Plan:   Acute hypoxemic respiratory failure secondary to Covid pneumonia Post remdesivir, decadron Plan Continue proning as P/F ratio is < 150 Continue low TV ventilation Follow CXR Continue lasix for diuresis.  Increased dose to 40 mg q8  Sputum + for Enterobacter FAECALIS , ENTEROBACTER AEROGENES  Plan Continue ampicillin, ceftriaxone  Multifactorial shock with sedation and ARDS Wean down pressors  Acute Metabolic Encephalopathy Related to sedation  Monitor  Diabetes Mellitus II, with Hyperglycemia Continue Levemir, SSI Increase tube feed coverage with NovoLog  Anemia, multifactorial, related to critical illness and blood draws  Continue to observe  Best practice:  Diet: Tube feeds per protocol Pain/Anxiety/Delirium protocol (if indicated): Fentanyl, propofol VAP protocol (if indicated): In place DVT prophylaxis: 0.5 mg/kg lovenox BID GI prophylaxis: would do pepcid while on AC and steroids Glucose control: Insulin Mobility: BR Code Status: Full Family Communication: wife updated 3/18 Disposition:  ICU   The patient is critically ill with multiple organ system failure and requires high complexity decision making for assessment and support, frequent evaluation and titration of therapies, advanced monitoring, review of radiographic studies and interpretation of complex data.   Critical Care Time devoted to patient care  services, exclusive of separately billable procedures, described in this note is 35 minutes.   Marshell Garfinkel MD  Pulmonary and Critical Care Please see [email or web address] for pager details.  07/16/2019, 9:37 AM

## 2019-07-16 NOTE — Progress Notes (Signed)
Critical ABG results given to RN.   PH: 7.36 PaCO2 66 PaO2: 54 HCO3: 37 sO2: 85%  PRVC VT: 460 RR: 28 PEEP: 12 FIO2: 100%

## 2019-07-16 NOTE — Progress Notes (Signed)
Pt placed in supine position, HOB >30 without any apparent complications. Pt due to be placed in prone at 2100 tonight.

## 2019-07-17 ENCOUNTER — Inpatient Hospital Stay (HOSPITAL_COMMUNITY): Payer: Medicare HMO

## 2019-07-17 DIAGNOSIS — A419 Sepsis, unspecified organism: Secondary | ICD-10-CM

## 2019-07-17 DIAGNOSIS — R6521 Severe sepsis with septic shock: Secondary | ICD-10-CM

## 2019-07-17 LAB — POCT I-STAT 7, (LYTES, BLD GAS, ICA,H+H)
Acid-Base Excess: 14 mmol/L — ABNORMAL HIGH (ref 0.0–2.0)
Acid-Base Excess: 10 mmol/L — ABNORMAL HIGH (ref 0.0–2.0)
Bicarbonate: 41.9 mmol/L — ABNORMAL HIGH (ref 20.0–28.0)
Bicarbonate: 37.2 mmol/L — ABNORMAL HIGH (ref 20.0–28.0)
Calcium, Ion: 1.22 mmol/L (ref 1.15–1.40)
Calcium, Ion: 1.17 mmol/L (ref 1.15–1.40)
HCT: 31 % — ABNORMAL LOW (ref 39.0–52.0)
HCT: 24 % — ABNORMAL LOW (ref 39.0–52.0)
Hemoglobin: 10.5 g/dL — ABNORMAL LOW (ref 13.0–17.0)
Hemoglobin: 8.2 g/dL — ABNORMAL LOW (ref 13.0–17.0)
O2 Saturation: 85 %
O2 Saturation: 86 %
Patient temperature: 37.4
Patient temperature: 98.6
Potassium: 4 mmol/L (ref 3.5–5.1)
Potassium: 3.9 mmol/L (ref 3.5–5.1)
Sodium: 143 mmol/L (ref 135–145)
Sodium: 144 mmol/L (ref 135–145)
TCO2: 44 mmol/L — ABNORMAL HIGH (ref 22–32)
TCO2: 39 mmol/L — ABNORMAL HIGH (ref 22–32)
pCO2 arterial: 76.9 mmHg (ref 32.0–48.0)
pCO2 arterial: 68.9 mmHg (ref 32.0–48.0)
pH, Arterial: 7.346 — ABNORMAL LOW (ref 7.350–7.450)
pH, Arterial: 7.34 — ABNORMAL LOW (ref 7.350–7.450)
pO2, Arterial: 58 mmHg — ABNORMAL LOW (ref 83.0–108.0)
pO2, Arterial: 56 mmHg — ABNORMAL LOW (ref 83.0–108.0)

## 2019-07-17 LAB — CBC
HCT: 33.2 % — ABNORMAL LOW (ref 39.0–52.0)
Hemoglobin: 10 g/dL — ABNORMAL LOW (ref 13.0–17.0)
MCH: 30.7 pg (ref 26.0–34.0)
MCHC: 30.1 g/dL (ref 30.0–36.0)
MCV: 101.8 fL — ABNORMAL HIGH (ref 80.0–100.0)
Platelets: 251 10*3/uL (ref 150–400)
RBC: 3.26 MIL/uL — ABNORMAL LOW (ref 4.22–5.81)
RDW: 14.7 % (ref 11.5–15.5)
WBC: 10.7 10*3/uL — ABNORMAL HIGH (ref 4.0–10.5)
nRBC: 0.7 % — ABNORMAL HIGH (ref 0.0–0.2)

## 2019-07-17 LAB — BASIC METABOLIC PANEL
Anion gap: 13 (ref 5–15)
BUN: 49 mg/dL — ABNORMAL HIGH (ref 8–23)
CO2: 37 mmol/L — ABNORMAL HIGH (ref 22–32)
Calcium: 9 mg/dL (ref 8.9–10.3)
Chloride: 96 mmol/L — ABNORMAL LOW (ref 98–111)
Creatinine, Ser: 1.03 mg/dL (ref 0.61–1.24)
GFR calc Af Amer: >60 mL/min (ref >60)
GFR calc non Af Amer: >60 mL/min (ref >60)
Glucose, Bld: 213 mg/dL — ABNORMAL HIGH (ref 70–99)
Potassium: 4.2 mmol/L (ref 3.5–5.1)
Sodium: 146 mmol/L — ABNORMAL HIGH (ref 135–145)

## 2019-07-17 LAB — GLUCOSE, CAPILLARY
Glucose-Capillary: 214 mg/dL — ABNORMAL HIGH (ref 70–99)
Glucose-Capillary: 224 mg/dL — ABNORMAL HIGH (ref 70–99)
Glucose-Capillary: 256 mg/dL — ABNORMAL HIGH (ref 70–99)
Glucose-Capillary: 276 mg/dL — ABNORMAL HIGH (ref 70–99)
Glucose-Capillary: 309 mg/dL — ABNORMAL HIGH (ref 70–99)
Glucose-Capillary: 330 mg/dL — ABNORMAL HIGH (ref 70–99)

## 2019-07-17 LAB — PHOSPHORUS: Phosphorus: 4.9 mg/dL — ABNORMAL HIGH (ref 2.5–4.6)

## 2019-07-17 LAB — TRIGLYCERIDES: Triglycerides: 168 mg/dL — ABNORMAL HIGH (ref <150)

## 2019-07-17 LAB — MAGNESIUM: Magnesium: 2 mg/dL (ref 1.7–2.4)

## 2019-07-17 MED ORDER — OXYCODONE HCL 5 MG/5ML PO SOLN: 5.0000 mg | Freq: Four times a day (QID) | ORAL | Status: DC

## 2019-07-17 MED ORDER — MIDAZOLAM 50MG/50ML (1MG/ML) PREMIX INFUSION
0.0000 mg/h | INTRAVENOUS | Status: DC
  Administered 2019-07-17: 7 mg/h via INTRAVENOUS
  Administered 2019-07-17: 12:00:00 2 mg/h via INTRAVENOUS
  Administered 2019-07-17: 7 mg/h via INTRAVENOUS
  Administered 2019-07-18: 6 mg/h via INTRAVENOUS
  Administered 2019-07-18: 8 mg/h via INTRAVENOUS
  Administered 2019-07-18: 7 mg/h via INTRAVENOUS
  Administered 2019-07-19 – 2019-07-23 (×11): 6 mg/h via INTRAVENOUS
  Administered 2019-07-23: 7 mg/h via INTRAVENOUS
  Filled 2019-07-17 (×21): qty 50

## 2019-07-17 MED ORDER — NOREPINEPHRINE 16 MG/250ML-% IV SOLN
0.0000 ug/min | INTRAVENOUS | Status: DC
  Administered 2019-07-17: 18 ug/min via INTRAVENOUS
  Administered 2019-07-18: 5 ug/min via INTRAVENOUS
  Administered 2019-07-19: 10 ug/min via INTRAVENOUS
  Administered 2019-07-20: 15 ug/min via INTRAVENOUS
  Administered 2019-07-22: 24 ug/min via INTRAVENOUS
  Administered 2019-07-23: 17 ug/min via INTRAVENOUS
  Filled 2019-07-17 (×7): qty 250

## 2019-07-17 MED ORDER — OXYCODONE HCL 5 MG PO TABS
10.0000 mg | ORAL_TABLET | Freq: Four times a day (QID) | ORAL | Status: DC
  Administered 2019-07-17 – 2019-07-23 (×25): 10 mg
  Filled 2019-07-17 (×26): qty 2

## 2019-07-17 MED ORDER — FUROSEMIDE 10 MG/ML IJ SOLN: 40.0000 mg | Freq: Three times a day (TID) | INTRAMUSCULAR | Status: DC

## 2019-07-17 MED ORDER — VASOPRESSIN 20 UNIT/ML IV SOLN
0.0300 [IU]/min | INTRAVENOUS | Status: DC
  Administered 2019-07-17 (×2): 0.03 [IU]/min via INTRAVENOUS
  Filled 2019-07-17 (×3): qty 2

## 2019-07-17 MED ORDER — PROPOFOL 1000 MG/100ML IV EMUL
25.0000 ug/kg/min | INTRAVENOUS | Status: DC
  Administered 2019-07-17: 60 ug/kg/min via INTRAVENOUS
  Filled 2019-07-17: qty 100

## 2019-07-17 MED ORDER — MIDAZOLAM BOLUS VIA INFUSION
1.0000 mg | INTRAVENOUS | Status: DC | PRN
  Administered 2019-07-20: 2 mg via INTRAVENOUS
  Filled 2019-07-17: qty 2

## 2019-07-17 MED ORDER — INSULIN DETEMIR 100 UNIT/ML ~~LOC~~ SOLN
21.0000 [IU] | Freq: Two times a day (BID) | SUBCUTANEOUS | Status: DC
  Administered 2019-07-17 – 2019-07-18 (×4): 21 [IU] via SUBCUTANEOUS
  Filled 2019-07-17 (×7): qty 0.21

## 2019-07-17 MED ORDER — STERILE WATER FOR INJECTION IJ SOLN
INTRAMUSCULAR | Status: AC
  Administered 2019-07-17: 10 mL
  Filled 2019-07-17: qty 10

## 2019-07-17 MED ORDER — INSULIN ASPART 100 UNIT/ML ~~LOC~~ SOLN
8.0000 [IU] | SUBCUTANEOUS | Status: DC
  Administered 2019-07-17 – 2019-07-21 (×25): 8 [IU] via SUBCUTANEOUS

## 2019-07-17 MED ORDER — FREE WATER
200.0000 mL | Status: DC
  Administered 2019-07-17 – 2019-07-18 (×6): 200 mL

## 2019-07-17 MED ORDER — SODIUM CHLORIDE 0.9 % IV SOLN
0.5000 mg/h | INTRAVENOUS | Status: DC
  Administered 2019-07-17: 4 mg/h via INTRAVENOUS
  Administered 2019-07-17: 0.5 mg/h via INTRAVENOUS
  Administered 2019-07-18 – 2019-07-23 (×10): 4 mg/h via INTRAVENOUS
  Filled 2019-07-17 (×17): qty 5

## 2019-07-17 MED ORDER — HYDROMORPHONE BOLUS VIA INFUSION
0.5000 mg | INTRAVENOUS | Status: DC | PRN
  Administered 2019-07-22: 0.5 mg via INTRAVENOUS
  Filled 2019-07-17: qty 1

## 2019-07-17 MED ORDER — CLONAZEPAM 1 MG PO TABS
1.0000 mg | ORAL_TABLET | Freq: Two times a day (BID) | ORAL | Status: DC
  Administered 2019-07-17 – 2019-07-23 (×13): 1 mg
  Filled 2019-07-17 (×13): qty 1

## 2019-07-17 NOTE — Progress Notes (Addendum)
NAME:  Bruce Weaver, MRN:  [id number], DOB:  May 10, 1940, LOS: 51 ADMISSION DATE:  07/22/2019, CONSULTATION DATE:  07/05/19 REFERRING MD:  Lonny Prude, CHIEF COMPLAINT:  SOB   Brief History   79 year old man with metabolic syndrome presenting with severe COVID pneumonia.  Transferred from [place].  Diagnosed on 3/2.  Progressive hypoxemia and delirium prompting transfer to [facility] and ultimately intubation for ARDS.   Past Medical History  DM2 HTN HLD Obesity  Significant Hospital Events   3/1 admitted to [facility] 3/2 diagnosed with COVID 3/5 4L to 15L, transferred to [facility] 3/6 PCCM consult 07/07/2019 required intubation 07/07/2019 starting proning at 1600 hrs. 3/13 partial dislodgment of his arterial line-did have moderate bleeding 3/15 Transfer from [place] to [place], resumed proning  Consults:  PCCM  Procedures:  07/07/2019 intubation>> 07/08/2018 right CVL>>  Significant Diagnostic Tests:  CXR- multifocal pneumonia CTA OSH neg for PE  Micro Data:  CRP low D dimer pretty low Pct neg 07/09/2019 blood cultures x2>>No growth 07/09/2019 sputum culture>>Enterobacter FAECALIS , ENTEROBACTER AEROGENES  07/09/2018 1 repeat procalcitonin>> 3/11 Urine>> No Growth  Antimicrobials:  Remdesivir completed Ampicillin 3/16 >> Ceftriaxone 3/16 >>  Interim history/subjective:  Increasing Pressor needs overnight Levo Quad strengthed ABG shows compensated respiratory acidosis with increase in CO2 and Bicarb overnight Remains proned.  + 1900 cc's T Max 99.7>> WBC is 10.7 D dimer increase to 1.56 on 3/17 Contraction alkalosis  Objective   Blood pressure (!) 121/56, pulse (!) 106, temperature 99.9 F (37.7 C), resp. rate (!) 28, height 6' (1.829 m), weight 120 kg, SpO2 94 %.    Vent Mode: PRVC FiO2 (%):  [100 %] 100 % Set Rate:  [28 bmp] 28 bmp Vt Set:  [460 mL] 460 mL PEEP:  [12 cmH20] 12 cmH20 Plateau Pressure:  [16 cmH20-32 cmH20] 16 cmH20   Intake/Output Summary (Last 24 hours) at 07/17/2019  0814 Last data filed at 07/17/2019 0600 Gross per 24 hour  Intake 2131.92 ml  Output 5400 ml  Net -3268.08 ml   Filed Weights   07/15/19 0432 07/16/19 0500 07/17/19 0700  Weight: 122.8 kg 120.1 kg 120 kg    Examination: Gen:      No acute distress, remains sedated , intubated and  proned HEENT:  EOMI, sclera anicteric, ETT Neck:     No masses; no thyromegaly Lungs:    Bilateral excursion. distant BS CV:         S1, S2, Regular rate and rhythm; no murmurs, rub or gallop Abd:      + bowel sounds; soft, non-tender; no palpable masses, no distension Ext:    Trace  edema; adequate peripheral perfusion Skin:      Warm and dry; no rash or lesions Neuro: Sedated, unresponsive  Lab significant for glucose 213, creatinine 1.03, WBC 10.7, hemoglobin 10.0 No CXR  Assessment & Plan:   Acute hypoxemic respiratory failure secondary to Covid pneumonia Post remdesivir, decadron Plan Continue proning as P/F ratio is < 150 Continue low TV ventilation Trend  CXR ABG in am and prn lasix holiday x 24 hours ( contraction alkalosis) Was receiving 40 mg TID>> can resume 3/20 if Contraction alkalosis improved  Sputum + for Enterobacter FAECALIS , ENTEROBACTER AEROGENES  Plan Continue ampicillin, ceftriaxone Trend fever and WBC Re-culture as is clinically indicated  Multifactorial shock with sedation and ARDS Increased pressor needs overnight Remains on Levo and Vasopressin - Will change Fentanyl to Dilaudid to see if we can wean propofol which may be  contributing to low BP - Wean down pressors as able with MAP goal of > 65  Acute Metabolic Encephalopathy Related to sedation  Monitor  Diabetes Mellitus II, with Hyperglycemia CBG Q 4 Continue Levemir, SSI Increase tube feed coverage with NovoLog  Anemia, multifactorial, related to critical illness and blood draws  Continue to observe Trend CBC Monitor for bleeding  Will hold Lasix today for contraction alkalosis x 24 hours     Resume 3/20 if labs show improvement  Best practice:  Diet: Tube feeds per protocol Pain/Anxiety/Delirium protocol (if indicated): Fentanyl, propofol VAP protocol (if indicated): In place DVT prophylaxis: 0.5 mg/kg lovenox BID GI prophylaxis: would do pepcid while on AC and steroids Glucose control: Insulin Mobility: BR Code Status: Full Family Communication: wife updated 3/18 Disposition:  ICU   CC APP time 5 minutes   Magdalen Spatz, MSN, AGACNP-BC [place] Pager # [phone] After 4 pm please call [phone] 07/17/2019, 8:14 AM

## 2019-07-17 NOTE — Progress Notes (Signed)
eLink Physician-Brief Progress Note Patient Name: Bruce Weaver DOB: 07-27-1940 MRN: [id number]   Date of Service  07/17/2019  HPI/Events of Note  RT asking if PT should be proned based on his most recent ABG on FIO2 of 80 %. Pt meets criteria for proning.  eICU Interventions  Order to prone patient given.        Frederik Pear 07/17/2019, 9:38 PM

## 2019-07-17 NOTE — Progress Notes (Signed)
PT flipped to prone position at [id number] with no complications.

## 2019-07-17 NOTE — Progress Notes (Signed)
Inpatient Diabetes Program Recommendations  AACE/ADA: New Consensus Statement on Inpatient Glycemic Control (2015)  Target Ranges:  Prepandial:   less than 140 mg/dL      Peak postprandial:   less than 180 mg/dL (1-2 hours)      Critically ill patients:  140 - 180 mg/dL   Lab Results  Component Value Date   GLUCAP 276 (H) 07/17/2019   HGBA1C 8.0 (H) 07/18/2019    Review of Glycemic Control Results for Bruce Weaver, Bruce Weaver (MRN [id number]) as of 07/17/2019 10:15  Ref. Range 07/16/2019 15:12 07/16/2019 19:33 07/16/2019 23:34 07/17/2019 03:21 07/17/2019 08:17  Glucose-Capillary Latest Ref Range: 70 - 99 mg/dL 232 (H) 181 (H) 197 (H) 214 (H) 276 (H)   Diabetes history: DM 2 Outpatient Diabetes medications: Glipizide 10 mg bid, Metformin 1000 mg bid, Actos 45 mg Daily, Januvia 100 mg Daily Current orders for Inpatient glycemic control:  Levemir 21 units bid Novolog 0-15 units Q4 hours Novolog 8 units q4 hours tube feed coverage  Vital 1.5 Cal 45 ml/hour  Inpatient Diabetes Program Recommendations:    Increase Tube Feed Coverage to Novolog 10 units Q4 hours.  Thanks,  Tama Headings RN, MSN, BC-ADM Inpatient Diabetes Coordinator Team Pager [phone] (8a-5p)

## 2019-07-17 NOTE — Progress Notes (Signed)
Spoke with Wife, Lorriane Shire, on phone for patient update. She was unaware that he was on 100% Fi02, but knows that he is on the ventilator and critically ill. Notified her of current patient status and plan of care. All questions answered at this time.

## 2019-07-17 NOTE — Progress Notes (Signed)
Pt placed out of prone position into supine at 1330. HOB >30 degrees, SATs initially dropped to 84%. After about 6 minutes they recovered back to 90%, no vent settings adjusted. Pt remains on 14 of PEEP and 100% at this time. Will wean FIO2 if pt tolerates it.

## 2019-07-17 NOTE — Progress Notes (Signed)
eLink Physician-Brief Progress Note Patient Name: Bruce Weaver DOB: 01-30-1941 MRN: [id number]   Date of Service  07/17/2019  HPI/Events of Note  Hypotension requiring escalating doses of levophed (currently on 17 mcg/min). COVID+ patient who is on PEEP 12, 100% and proned.  RN requests ABG to evaluate for acidosis as contributor since patient has been proned since last blood gas.  Also requests levophed be quad-strength +/- addition of vasopressin.   eICU Interventions  ABG ordered.  Levophed changed to quad-strength.  Vaso added as a sparing agent.     Intervention Category Major Interventions: Hypotension - evaluation and management  Marily Lente Mackenzey [place]over 07/17/2019, 1:30 AM

## 2019-07-17 NOTE — Progress Notes (Signed)
Wasted 5055mcg/100mL of Fentanyl from bag with Geroge Baseman, RN. Waste in [employer]

## 2019-07-18 ENCOUNTER — Inpatient Hospital Stay (HOSPITAL_COMMUNITY): Payer: Medicare HMO

## 2019-07-18 LAB — POCT I-STAT 7, (LYTES, BLD GAS, ICA,H+H)
Acid-Base Excess: 15 mmol/L — ABNORMAL HIGH (ref 0.0–2.0)
Acid-Base Excess: 16 mmol/L — ABNORMAL HIGH (ref 0.0–2.0)
Acid-Base Excess: 16 mmol/L — ABNORMAL HIGH (ref 0.0–2.0)
Bicarbonate: 43.6 mmol/L — ABNORMAL HIGH (ref 20.0–28.0)
Bicarbonate: 43.7 mmol/L — ABNORMAL HIGH (ref 20.0–28.0)
Bicarbonate: 43.8 mmol/L — ABNORMAL HIGH (ref 20.0–28.0)
Calcium, Ion: 1.23 mmol/L (ref 1.15–1.40)
Calcium, Ion: 1.23 mmol/L (ref 1.15–1.40)
Calcium, Ion: 1.24 mmol/L (ref 1.15–1.40)
HCT: 27 % — ABNORMAL LOW (ref 39.0–52.0)
HCT: 28 % — ABNORMAL LOW (ref 39.0–52.0)
HCT: 32 % — ABNORMAL LOW (ref 39.0–52.0)
Hemoglobin: 10.9 g/dL — ABNORMAL LOW (ref 13.0–17.0)
Hemoglobin: 9.2 g/dL — ABNORMAL LOW (ref 13.0–17.0)
Hemoglobin: 9.5 g/dL — ABNORMAL LOW (ref 13.0–17.0)
O2 Saturation: 81 %
O2 Saturation: 86 %
O2 Saturation: 87 %
Patient temperature: 37.2
Patient temperature: 37.2
Patient temperature: 98.4
Potassium: 3.8 mmol/L (ref 3.5–5.1)
Potassium: 4.1 mmol/L (ref 3.5–5.1)
Potassium: 4.6 mmol/L (ref 3.5–5.1)
Sodium: 142 mmol/L (ref 135–145)
Sodium: 143 mmol/L (ref 135–145)
Sodium: 144 mmol/L (ref 135–145)
TCO2: 46 mmol/L — ABNORMAL HIGH (ref 22–32)
TCO2: 46 mmol/L — ABNORMAL HIGH (ref 22–32)
TCO2: 46 mmol/L — ABNORMAL HIGH (ref 22–32)
pCO2 arterial: 74.7 mmHg (ref 32.0–48.0)
pCO2 arterial: 76.8 mmHg (ref 32.0–48.0)
pCO2 arterial: 85.2 mmHg (ref 32.0–48.0)
pH, Arterial: 7.319 — ABNORMAL LOW (ref 7.350–7.450)
pH, Arterial: 7.362 (ref 7.350–7.450)
pH, Arterial: 7.377 (ref 7.350–7.450)
pO2, Arterial: 49 mmHg — ABNORMAL LOW (ref 83.0–108.0)
pO2, Arterial: 57 mmHg — ABNORMAL LOW (ref 83.0–108.0)
pO2, Arterial: 61 mmHg — ABNORMAL LOW (ref 83.0–108.0)

## 2019-07-18 LAB — GLUCOSE, CAPILLARY
Glucose-Capillary: 118 mg/dL — ABNORMAL HIGH (ref 70–99)
Glucose-Capillary: 198 mg/dL — ABNORMAL HIGH (ref 70–99)
Glucose-Capillary: 238 mg/dL — ABNORMAL HIGH (ref 70–99)
Glucose-Capillary: 301 mg/dL — ABNORMAL HIGH (ref 70–99)
Glucose-Capillary: 271 mg/dL — ABNORMAL HIGH (ref 70–99)
Glucose-Capillary: 248 mg/dL — ABNORMAL HIGH (ref 70–99)

## 2019-07-18 LAB — CBC
HCT: 30.4 % — ABNORMAL LOW (ref 39.0–52.0)
Hemoglobin: 9.1 g/dL — ABNORMAL LOW (ref 13.0–17.0)
MCH: 30.7 pg (ref 26.0–34.0)
MCHC: 29.9 g/dL — ABNORMAL LOW (ref 30.0–36.0)
MCV: 102.7 fL — ABNORMAL HIGH (ref 80.0–100.0)
Platelets: 212 10*3/uL (ref 150–400)
RBC: 2.96 MIL/uL — ABNORMAL LOW (ref 4.22–5.81)
RDW: 14.8 % (ref 11.5–15.5)
WBC: 9.7 10*3/uL (ref 4.0–10.5)
nRBC: 0.8 % — ABNORMAL HIGH (ref 0.0–0.2)

## 2019-07-18 LAB — PHOSPHORUS: Phosphorus: 4.5 mg/dL (ref 2.5–4.6)

## 2019-07-18 LAB — BASIC METABOLIC PANEL
Anion gap: 9 (ref 5–15)
BUN: 54 mg/dL — ABNORMAL HIGH (ref 8–23)
CO2: 39 mmol/L — ABNORMAL HIGH (ref 22–32)
Calcium: 9 mg/dL (ref 8.9–10.3)
Chloride: 98 mmol/L (ref 98–111)
Creatinine, Ser: 1.01 mg/dL (ref 0.61–1.24)
GFR calc Af Amer: >60 mL/min (ref >60)
GFR calc non Af Amer: >60 mL/min (ref >60)
Glucose, Bld: 133 mg/dL — ABNORMAL HIGH (ref 70–99)
Potassium: 4.3 mmol/L (ref 3.5–5.1)
Sodium: 146 mmol/L — ABNORMAL HIGH (ref 135–145)

## 2019-07-18 LAB — MAGNESIUM: Magnesium: 2.2 mg/dL (ref 1.7–2.4)

## 2019-07-18 MED ORDER — AMIODARONE LOAD VIA INFUSION
150.0000 mg | Freq: Once | INTRAVENOUS | Status: AC
  Administered 2019-07-18: 150 mg via INTRAVENOUS
  Filled 2019-07-18: qty 83.34

## 2019-07-18 MED ORDER — FLUTICASONE PROPIONATE 50 MCG/ACT NA SUSP
1.0000 | Freq: Every day | NASAL | Status: DC
  Administered 2019-07-18 – 2019-07-23 (×6): 1 via NASAL
  Filled 2019-07-18: qty 16

## 2019-07-18 MED ORDER — PRO-STAT SUGAR FREE PO LIQD
60.0000 mL | Freq: Four times a day (QID) | ORAL | Status: DC
  Administered 2019-07-18 – 2019-07-23 (×21): 60 mL
  Filled 2019-07-18 (×22): qty 60

## 2019-07-18 MED ORDER — PRAVASTATIN SODIUM 40 MG PO TABS
40.0000 mg | ORAL_TABLET | Freq: Every day | ORAL | Status: DC
  Administered 2019-07-18 – 2019-07-22 (×5): 40 mg
  Filled 2019-07-18 (×5): qty 1

## 2019-07-18 MED ORDER — OXYMETAZOLINE HCL 0.05 % NA SOLN
1.0000 | Freq: Two times a day (BID) | NASAL | Status: AC
  Administered 2019-07-18 – 2019-07-19 (×4): 1 via NASAL
  Filled 2019-07-18: qty 30

## 2019-07-18 MED ORDER — AZELASTINE HCL 0.1 % NA SOLN
1.0000 | Freq: Two times a day (BID) | NASAL | Status: DC
  Administered 2019-07-18 – 2019-07-20 (×5): 1 via NASAL
  Filled 2019-07-18: qty 30

## 2019-07-18 MED ORDER — AMIODARONE HCL IN DEXTROSE 360-4.14 MG/200ML-% IV SOLN
30.0000 mg/h | INTRAVENOUS | Status: DC
  Administered 2019-07-18 (×2): 30 mg/h via INTRAVENOUS
  Filled 2019-07-18: qty 200

## 2019-07-18 MED ORDER — ADULT MULTIVITAMIN LIQUID CH
15.0000 mL | Freq: Every day | ORAL | Status: DC
  Administered 2019-07-19 – 2019-07-23 (×5): 15 mL
  Filled 2019-07-18 (×5): qty 15

## 2019-07-18 MED ORDER — FREE WATER
110.0000 mL | Status: DC
  Administered 2019-07-18 – 2019-07-23 (×61): 110 mL

## 2019-07-18 MED ORDER — PRAVASTATIN SODIUM 40 MG PO TABS: 40.0000 mg | ORAL_TABLET | Freq: Every day | ORAL | Status: DC

## 2019-07-18 MED ORDER — AMIODARONE HCL IN DEXTROSE 360-4.14 MG/200ML-% IV SOLN
60.0000 mg/h | INTRAVENOUS | Status: DC
  Administered 2019-07-18 (×2): 60 mg/h via INTRAVENOUS
  Filled 2019-07-18 (×2): qty 200

## 2019-07-18 MED ORDER — ACETAMINOPHEN 325 MG PO TABS
650.0000 mg | ORAL_TABLET | Freq: Four times a day (QID) | ORAL | Status: DC | PRN
  Administered 2019-07-19 (×2): 650 mg
  Filled 2019-07-18 (×2): qty 2

## 2019-07-18 MED ORDER — FUROSEMIDE 10 MG/ML IJ SOLN
40.0000 mg | Freq: Once | INTRAMUSCULAR | Status: AC
  Administered 2019-07-18: 40 mg via INTRAVENOUS
  Filled 2019-07-18: qty 4

## 2019-07-18 NOTE — Progress Notes (Signed)
Developed A fib with RVR.  Will start amiodarone.  Chesley Mires, MD [facility] Pulmonary/Critical Care 07/18/2019, 9:33 AM

## 2019-07-18 NOTE — Progress Notes (Signed)
   NAME:  Bruce Weaver, MRN:  [id number], DOB:  [birth year], LOS: 78 ADMISSION DATE:  07/16/2019, CONSULTATION DATE:  07/05/19 REFERRING MD:  Lonny Prude, CHIEF COMPLAINT:  SOB   Brief History   79 year old man with metabolic syndrome presenting with severe COVID pneumonia.  Transferred from [place].  Diagnosed on 3/2.  Progressive hypoxemia and delirium prompting transfer to [facility] and ultimately intubation for ARDS.   Past Medical History  DM2 HTN HLD Obesity  Significant Hospital Events   3/1 admitted to [facility] 3/2 diagnosed with COVID 3/5 4L to 15L, transferred to [facility] 3/6 PCCM consult 07/07/2019 required intubation 07/07/2019 starting proning at 1600 hrs. 3/13 partial dislodgment of his arterial line-did have moderate bleeding 3/15 Transfer from [place] to 28M, resumed proning 3/20 prone positioning  Consults:    Procedures:  3/09 intubation>> 3/10 right IJ CVL>> 3/10 Rt radial a line >>  Significant Diagnostic Tests:  CTA OSH neg for PE  Micro Data:  3/11 blood cultures x2>>No growth 3/11 sputum culture>>Enterobacter FAECALIS , ENTEROBACTER AEROGENES  3/11 Urine>> No Growth  Antimicrobials:  Remdesivir completed Ampicillin 3/16 >> Ceftriaxone 3/16 >>  Interim history/subjective:  Remains on pressors, increased PEEP/FiO2.  Objective   Blood pressure 123/60, pulse (!) 111, temperature 99.5 F (37.5 C), resp. rate (!) 29, height 6' (1.829 m), weight 120 kg, SpO2 92 %. CVP:  [8 mmHg] 8 mmHg  Vent Mode: PRVC FiO2 (%):  [80 %-100 %] 100 % Set Rate:  [28 bmp-30 bmp] 30 bmp Vt Set:  [460 mL] 460 mL PEEP:  [12 cmH20-16 cmH20] 16 cmH20 Plateau Pressure:  [16 cmH20-37 cmH20] 37 cmH20   Intake/Output Summary (Last 24 hours) at 07/18/2019 0750 Last data filed at 07/18/2019 0507 Gross per 24 hour  Intake 3497.94 ml  Output 1670 ml  Net 1827.94 ml   Filed Weights   07/15/19 0432 07/16/19 0500 07/17/19 0700  Weight: 122.8 kg 120.1 kg 120 kg    Examination:  General - prone  positioning Eyes - pupils reactive ENT - ETT in place Cardiac - regular rate/rhythm, no murmur Chest - b/l crackles Abdomen - soft, non tender Extremities - 2+ edema Skin - no rashes Neuro - RASS -4   Assessment & Plan:   Acute hypoxic, hypercapnicrespiratory failure with ARDS in setting of COVID 19 pneumonia complicated by Enterobacter HCAP. - completed remdesivir and decadron - prone positioning if PaO2:FiO2 < 150 - goal plateau pressure < 30, driving force < 15 - f/u CXR, ABG - day 5 of ABx, currently on ampicillin and rocephin - lasix 40 mg IV x one 3/20  Septic shock. - pressors to keep MAP > 65  Acute metabolic encephalopathy 2nd to sepsis, hypoxia. 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 - SSI with levemir and tube feed coverage - hold outpt glipizide, metformin, actos, januvia, lyrica  Nasal congestion. - afrin x 2 days - start flonase, astelin  Anemia of critical illness. - f/u CBC - transfuse for Hb < 7 or significant bleeding  Hx of HTN, HLD. - continue pravachol - hold outpt cozaar   Best practice:  Diet: Tube feeds per protocol DVT prophylaxis: lovenox GI prophylaxis: pepcid Mobility: BR Code Status: Full Disposition:  ICU   CC time 33 minutes  Chesley Mires, MD [place] 07/18/2019, 7:59 AM

## 2019-07-18 NOTE — Progress Notes (Signed)
Patient turned back to supine position at this time without complications. ETT still in correct position.

## 2019-07-18 NOTE — Progress Notes (Signed)
eLink Physician-Brief Progress Note Patient Name: Bruce Weaver DOB: Feb 08, 1941 MRN: [id number]   Date of Service  07/18/2019  HPI/Events of Note  ABG:  7.36/ 49/ 76.8/  PEEP 14  eICU Interventions  Will increase PEEP ti 16 and repeat ABG in 1 hour, if still no better than Pt's supine ABG I will switch  Pt to supine.        Bruce Weaver U Bruce Weaver 07/18/2019, 1:15 AM

## 2019-07-18 NOTE — Progress Notes (Signed)
Nutrition Follow-up  **RD working remotely**  DOCUMENTATION CODES:   Not applicable  INTERVENTION:  -Vital 1.5 cal @ 2m/hr (10834m -6039mro-stat QID -110m43mee water Q2h  Tube feeding regimen will provide 2420 kcals (meets 91% estimated calorie needs), 192 grams protein (>100% estimated protein needs), 2145ml44me water  -MVI daily  NUTRITION DIAGNOSIS:   Increased nutrient needs related to acute illness(COVID-19 infection) as evidenced by estimated needs.  Ongoing.  GOAL:   Patient will meet greater than or equal to 90% of their needs  Met with tube feeding.  MONITOR:   Vent status, TF tolerance, Labs, Weight trends  REASON FOR ASSESSMENT:   Ventilator, Consult Enteral/tube feeding initiation and management  ASSESSMENT:   78 y.54 male with medical history of type 2 DM, HTN, and HLD. Patient received the first dose of COVID vaccine on 2/25. He presented to the ED with generalized weakness and fatigue which have been worsening. [facility] and at that time was found to have multifocal PNA and he tested positive for COVID-19 on 3/2 and was started on remdesivir and steroids. CT chest negative for PE, indicated COVID-19 PNA. [facility].  3/2 - diagnosed with COVID 3/8 - NG placed  3/10 - pt intubated 3/13 - partial dislodgment of pt's arterial line 3/16 [facility]   Pt proning.   Current tube feeding regimen: 30ml 20mstat 6x daily, 200ml f46mwater Q4, Vital 1.5 cal @ 45ml/hr28mtient is currently intubated on ventilator support MV: 13.8 L/min Temp (24hrs), Avg:98.9 F (37.2 C), Min:98.2 F (36.8 C), Max:99.7 F (37.6 C)  UOP: 1,670ml x2475mrs I/O: +3,821.7ml since15mmit  Medications: Pepcid, SSI, Novolog, Levemir, Miralax Drips: Dilaudid, Levophed  Labs: Na 146 (H), CBGs 118-309  Diet Order:   Diet Order    None      EDUCATION NEEDS:   No education needs have been identified at this time  Skin:  Skin Assessment: Skin Integrity Issues: Skin Integrity Issues:: Other (Comment) Other: MASD groin  Last BM:  3/17  Height:   Ht Readings from Last 1 Encounters:  07/14/19 6' (1.829 m)    Weight:   Wt Readings from Last 1 Encounters:  07/17/19 120 kg    BMI:  Body mass index is 35.88 kg/m.  Estimated Nutritional Needs:   Kcal:  2642-3294 6754-4920  150-185 grams  Fluid:  >/= 2L/d   Jamilee Lafosse AveLarkin InaLDN RD pager number and weekend/on-call pager number located in [email or web address]

## 2019-07-19 ENCOUNTER — Inpatient Hospital Stay (HOSPITAL_COMMUNITY): Payer: Medicare HMO

## 2019-07-19 DIAGNOSIS — J9602 Acute respiratory failure with hypercapnia: Secondary | ICD-10-CM

## 2019-07-19 LAB — CBC
HCT: 31.1 % — ABNORMAL LOW (ref 39.0–52.0)
Hemoglobin: 9.2 g/dL — ABNORMAL LOW (ref 13.0–17.0)
MCH: 31 pg (ref 26.0–34.0)
MCHC: 29.6 g/dL — ABNORMAL LOW (ref 30.0–36.0)
MCV: 104.7 fL — ABNORMAL HIGH (ref 80.0–100.0)
Platelets: 219 10*3/uL (ref 150–400)
RBC: 2.97 MIL/uL — ABNORMAL LOW (ref 4.22–5.81)
RDW: 15.1 % (ref 11.5–15.5)
WBC: 9.7 10*3/uL (ref 4.0–10.5)
nRBC: 1.2 % — ABNORMAL HIGH (ref 0.0–0.2)

## 2019-07-19 LAB — POCT I-STAT 7, (LYTES, BLD GAS, ICA,H+H)
Acid-Base Excess: 16 mmol/L — ABNORMAL HIGH (ref 0.0–2.0)
Bicarbonate: 44.3 mmol/L — ABNORMAL HIGH (ref 20.0–28.0)
Calcium, Ion: 1.23 mmol/L (ref 1.15–1.40)
HCT: 28 % — ABNORMAL LOW (ref 39.0–52.0)
Hemoglobin: 9.5 g/dL — ABNORMAL LOW (ref 13.0–17.0)
O2 Saturation: 88 %
Patient temperature: 37.3
Potassium: 4.5 mmol/L (ref 3.5–5.1)
Sodium: 141 mmol/L (ref 135–145)
TCO2: 47 mmol/L — ABNORMAL HIGH (ref 22–32)
pCO2 arterial: 79.6 mmHg (ref 32.0–48.0)
pH, Arterial: 7.354 (ref 7.350–7.450)
pO2, Arterial: 62 mmHg — ABNORMAL LOW (ref 83.0–108.0)

## 2019-07-19 LAB — BASIC METABOLIC PANEL
Anion gap: 8 (ref 5–15)
BUN: 61 mg/dL — ABNORMAL HIGH (ref 8–23)
CO2: 38 mmol/L — ABNORMAL HIGH (ref 22–32)
Calcium: 8.7 mg/dL — ABNORMAL LOW (ref 8.9–10.3)
Chloride: 97 mmol/L — ABNORMAL LOW (ref 98–111)
Creatinine, Ser: 1.04 mg/dL (ref 0.61–1.24)
GFR calc Af Amer: >60 mL/min (ref >60)
GFR calc non Af Amer: >60 mL/min (ref >60)
Glucose, Bld: 274 mg/dL — ABNORMAL HIGH (ref 70–99)
Potassium: 4.6 mmol/L (ref 3.5–5.1)
Sodium: 143 mmol/L (ref 135–145)

## 2019-07-19 LAB — GLUCOSE, CAPILLARY
Glucose-Capillary: 226 mg/dL — ABNORMAL HIGH (ref 70–99)
Glucose-Capillary: 241 mg/dL — ABNORMAL HIGH (ref 70–99)
Glucose-Capillary: 264 mg/dL — ABNORMAL HIGH (ref 70–99)
Glucose-Capillary: 243 mg/dL — ABNORMAL HIGH (ref 70–99)
Glucose-Capillary: 265 mg/dL — ABNORMAL HIGH (ref 70–99)
Glucose-Capillary: 267 mg/dL — ABNORMAL HIGH (ref 70–99)

## 2019-07-19 MED ORDER — STERILE WATER FOR INJECTION IJ SOLN
INTRAMUSCULAR | Status: AC
  Administered 2019-07-19: 10 mL
  Filled 2019-07-19: qty 10

## 2019-07-19 MED ORDER — INSULIN DETEMIR 100 UNIT/ML ~~LOC~~ SOLN
30.0000 [IU] | Freq: Two times a day (BID) | SUBCUTANEOUS | Status: DC
  Administered 2019-07-19 – 2019-07-20 (×2): 30 [IU] via SUBCUTANEOUS
  Filled 2019-07-19 (×3): qty 0.3

## 2019-07-19 MED ORDER — INSULIN DETEMIR 100 UNIT/ML ~~LOC~~ SOLN
40.0000 [IU] | Freq: Two times a day (BID) | SUBCUTANEOUS | Status: DC
  Filled 2019-07-19: qty 0.4

## 2019-07-19 MED ORDER — FUROSEMIDE 10 MG/ML IJ SOLN
40.0000 mg | Freq: Once | INTRAMUSCULAR | Status: AC
  Administered 2019-07-19: 40 mg via INTRAVENOUS
  Filled 2019-07-19: qty 4

## 2019-07-19 MED ORDER — INSULIN DETEMIR 100 UNIT/ML ~~LOC~~ SOLN
30.0000 [IU] | Freq: Two times a day (BID) | SUBCUTANEOUS | Status: DC
  Administered 2019-07-19: 30 [IU] via SUBCUTANEOUS
  Filled 2019-07-19 (×2): qty 0.3

## 2019-07-19 NOTE — Progress Notes (Signed)
Pt proned at this time with no complications. Mepilex added to face and bottom lip for abrasions. ETT remains securely taped and in proper position.

## 2019-07-19 NOTE — Progress Notes (Signed)
Ett secured with cloth tape and in proper position. Mepilex added under tape to protect skin. Multiple abrasions noted on bottom lip, RN made aware and shown. Mepilex and tape to cover and protect bottom lip while proned.

## 2019-07-19 NOTE — Progress Notes (Signed)
NAME:  Bruce Weaver, MRN:  [id number], DOB:  May 23, 1940, LOS: 23 ADMISSION DATE:  07/13/2019, CONSULTATION DATE:  07/05/19 REFERRING MD:  Lonny Prude, CHIEF COMPLAINT:  SOB   Brief History   79 year old man with metabolic syndrome presenting with severe COVID pneumonia.  Transferred from [place].  Diagnosed on 3/2.  Progressive hypoxemia and delirium prompting transfer to [facility] and ultimately intubation for ARDS.   Past Medical History  DM2 HTN HLD Obesity  Significant Hospital Events   3/1 admitted to [facility] 3/2 diagnosed with COVID 3/5 4L to 15L, transferred to [facility] 3/6 PCCM consult 07/07/2019 required intubation 07/07/2019 starting proning at 1600 hrs. 3/13 partial dislodgment of his arterial line-did have moderate bleeding 3/15 Transfer from [place] to 30M, resumed proning 3/20 prone positioning; a fib with RVR >> start amiodarone  Consults:    Procedures:  3/09 intubation>> 3/10 right IJ CVL>> 3/10 Rt radial a line >>  Significant Diagnostic Tests:  CTA OSH neg for PE  Micro Data:  3/11 blood cultures x2>>No growth 3/11 sputum culture>>Enterobacter FAECALIS , ENTEROBACTER AEROGENES  3/11 Urine>> No Growth  Antimicrobials:  Remdesivir completed Ampicillin 3/16 >> Ceftriaxone 3/16 >>  Interim history/subjective:  Back in sinus rhythm.  Supine positioning last night.  Remains on pressors.  Objective   Blood pressure (!) 112/39, pulse (!) 103, temperature 99.5 F (37.5 C), resp. rate (!) 27, height 6' (1.829 m), weight 120 kg, SpO2 94 %. CVP:  [4 mmHg-16 mmHg] 6 mmHg  Vent Mode: PRVC FiO2 (%):  [96 %-100 %] 100 % Set Rate:  [30 bmp] 30 bmp Vt Set:  [460 mL] 460 mL PEEP:  [16 cmH20] 16 cmH20 Plateau Pressure:  [28 cmH20-36 cmH20] 34 cmH20   Intake/Output Summary (Last 24 hours) at 07/19/2019 0736 Last data filed at 07/19/2019 0600 Gross per 24 hour  Intake 3049.56 ml  Output 1700 ml  Net 1349.56 ml   Filed Weights   07/15/19 0432 07/16/19 0500 07/17/19 0700    Weight: 122.8 kg 120.1 kg 120 kg    Examination:  General - sedated Eyes - pupils pinpoint, reactive ENT - ETT in place Cardiac - regular, tachycardic Chest - b/l rhonchi Abdomen - soft, non tender, + bowel sounds Extremities - 1+ edema Skin - no rashes Neuro - RASS -4   Assessment & Plan:   Acute hypoxic, hypercapnicrespiratory failure with ARDS in setting of COVID 19 pneumonia complicated by Enterobacter HCAP. - completed remdesivir and decadron - prone positioning if PaO2:FiO2 < 150 - goal plateau pressure < 30, driving force < 15 - plan to prone position again 3/21 - f/u CXR, ABG - day 6/10 of ABx, currently on ampicillin and rocephin - lasix 40 mg IV x one on 3/21  Septic shock. - wean pressors to keep MAP > 65  New onset A fib with RVR 3/20. - back in sinus rhythm 3/21 - allow current bag of amiodarone to complete and not renew, and monitor heart rhythm  Acute metabolic encephalopathy 2nd to sepsis, hypoxia. 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 - SSI with tube feed coverage - increase levemir to 30 units bid - hold outpt glipizide, metformin, actos, januvia, lyrica  Nasal congestion. - day 1/2 of afrin - continue flonase, astelin  Anemia of critical illness. - f/u CBC - transfuse for Hb < 7 or significant bleeding  Hx of HTN, HLD. - continue pravachol - hold outpt  cozaar   Best practice:  Diet: Tube feeds per protocol DVT prophylaxis: lovenox GI prophylaxis: pepcid Mobility: BR Code Status: Full Disposition:  ICU   Labs:   CMP Latest Ref Rng & Units 07/19/2019 07/19/2019 07/18/2019  Glucose 70 - 99 mg/dL 274(H) - -  BUN 8 - 23 mg/dL 61(H) - -  Creatinine 0.61 - 1.24 mg/dL 1.04 - -  Sodium 135 - 145 mmol/L 143 141 142  Potassium 3.5 - 5.1 mmol/L 4.6 4.5 4.6  Chloride 98 - 111 mmol/L 97(L) - -  CO2 22 - 32 mmol/L 38(H) - -  Calcium 8.9 - 10.3 mg/dL 8.7(L) - -   Total Protein 6.5 - 8.1 g/dL - - -  Total Bilirubin 0.3 - 1.2 mg/dL - - -  Alkaline Phos 38 - 126 U/L - - -  AST 15 - 41 U/L - - -  ALT 0 - 44 U/L - - -    CBC Latest Ref Rng & Units 07/19/2019 07/19/2019 07/18/2019  WBC 4.0 - 10.5 K/uL 9.7 - -  Hemoglobin 13.0 - 17.0 g/dL 9.2(L) 9.5(L) 10.9(L)  Hematocrit 39.0 - 52.0 % 31.1(L) 28.0(L) 32.0(L)  Platelets 150 - 400 K/uL 219 - -    ABG    Component Value Date/Time   PHART 7.354 07/19/2019 0415   PCO2ART 79.6 (HH) 07/19/2019 0415   PO2ART 62.0 (L) 07/19/2019 0415   HCO3 44.3 (H) 07/19/2019 0415   TCO2 47 (H) 07/19/2019 0415   O2SAT 88.0 07/19/2019 0415    CBG (last 3)  Recent Labs    07/18/19 1951 07/18/19 2305 07/19/19 0402  GLUCAP 271* 248* 226*    CXR (reviewed by me) - b/l ASD with improved aeration in upper lobes  CC time 34 minutes Chesley Mires, MD [facility] Pulmonary/Critical Care 07/19/2019, 7:36 AM

## 2019-07-19 NOTE — Progress Notes (Signed)
Lt lower leg noted to feel cooler than Rt lower leg.  Able to doppler Lt popliteal, posterior tibial, and dorsalis pedis pulses.  Chesley Mires, MD [facility] Pulmonary/Critical Care 07/19/2019, 10:49 AM

## 2019-07-20 ENCOUNTER — Inpatient Hospital Stay (HOSPITAL_COMMUNITY): Payer: Medicare HMO

## 2019-07-20 DIAGNOSIS — R579 Shock, unspecified: Secondary | ICD-10-CM

## 2019-07-20 DIAGNOSIS — Z9911 Dependence on respirator [ventilator] status: Secondary | ICD-10-CM

## 2019-07-20 LAB — BLOOD GAS, ARTERIAL
Acid-Base Excess: 10.8 mmol/L — ABNORMAL HIGH (ref 0.0–2.0)
Acid-Base Excess: 10.7 mmol/L — ABNORMAL HIGH (ref 0.0–2.0)
Bicarbonate: 39.2 mmol/L — ABNORMAL HIGH (ref 20.0–28.0)
Bicarbonate: 38.9 mmol/L — ABNORMAL HIGH (ref 20.0–28.0)
Drawn by: 560021
FIO2: 90
FIO2: 100
O2 Content: 61.1 L/min
O2 Saturation: 85.2 %
O2 Saturation: 88.2 %
Patient temperature: 36.4
Patient temperature: 36.2
pCO2 arterial: 107 mmHg (ref 32.0–48.0)
pCO2 arterial: 102 mmHg (ref 32.0–48.0)
pH, Arterial: 7.184 — CL (ref 7.350–7.450)
pH, Arterial: 7.199 — CL (ref 7.350–7.450)
pO2, Arterial: 56.8 mmHg — ABNORMAL LOW (ref 83.0–108.0)
pO2, Arterial: 57.9 mmHg — ABNORMAL LOW (ref 83.0–108.0)

## 2019-07-20 LAB — BASIC METABOLIC PANEL
Anion gap: 12 (ref 5–15)
BUN: 88 mg/dL — ABNORMAL HIGH (ref 8–23)
CO2: 36 mmol/L — ABNORMAL HIGH (ref 22–32)
Calcium: 8.9 mg/dL (ref 8.9–10.3)
Chloride: 93 mmol/L — ABNORMAL LOW (ref 98–111)
Creatinine, Ser: 1.72 mg/dL — ABNORMAL HIGH (ref 0.61–1.24)
GFR calc Af Amer: 43 mL/min — ABNORMAL LOW (ref >60)
GFR calc non Af Amer: 38 mL/min — ABNORMAL LOW (ref >60)
Glucose, Bld: 252 mg/dL — ABNORMAL HIGH (ref 70–99)
Potassium: 5.2 mmol/L — ABNORMAL HIGH (ref 3.5–5.1)
Sodium: 141 mmol/L (ref 135–145)

## 2019-07-20 LAB — CBC
HCT: 32 % — ABNORMAL LOW (ref 39.0–52.0)
Hemoglobin: 9.1 g/dL — ABNORMAL LOW (ref 13.0–17.0)
MCH: 30.7 pg (ref 26.0–34.0)
MCHC: 28.4 g/dL — ABNORMAL LOW (ref 30.0–36.0)
MCV: 108.1 fL — ABNORMAL HIGH (ref 80.0–100.0)
Platelets: 228 10*3/uL (ref 150–400)
RBC: 2.96 MIL/uL — ABNORMAL LOW (ref 4.22–5.81)
RDW: 15.3 % (ref 11.5–15.5)
WBC: 9.8 10*3/uL (ref 4.0–10.5)
nRBC: 2.2 % — ABNORMAL HIGH (ref 0.0–0.2)

## 2019-07-20 LAB — GLUCOSE, CAPILLARY
Glucose-Capillary: 230 mg/dL — ABNORMAL HIGH (ref 70–99)
Glucose-Capillary: 190 mg/dL — ABNORMAL HIGH (ref 70–99)
Glucose-Capillary: 182 mg/dL — ABNORMAL HIGH (ref 70–99)
Glucose-Capillary: 274 mg/dL — ABNORMAL HIGH (ref 70–99)
Glucose-Capillary: 278 mg/dL — ABNORMAL HIGH (ref 70–99)

## 2019-07-20 MED ORDER — INSULIN DETEMIR 100 UNIT/ML ~~LOC~~ SOLN
45.0000 [IU] | Freq: Two times a day (BID) | SUBCUTANEOUS | Status: DC
  Administered 2019-07-20 – 2019-07-23 (×5): 45 [IU] via SUBCUTANEOUS
  Filled 2019-07-20 (×8): qty 0.45

## 2019-07-20 MED ORDER — SODIUM ZIRCONIUM CYCLOSILICATE 10 G PO PACK
10.0000 g | PACK | Freq: Every day | ORAL | Status: DC
  Administered 2019-07-20: 10 g via ORAL
  Filled 2019-07-20: qty 1

## 2019-07-20 MED ORDER — FUROSEMIDE 10 MG/ML IJ SOLN
40.0000 mg | Freq: Two times a day (BID) | INTRAMUSCULAR | Status: AC
  Administered 2019-07-20 – 2019-07-21 (×2): 40 mg via INTRAVENOUS
  Filled 2019-07-20 (×2): qty 4

## 2019-07-20 MED ORDER — FUROSEMIDE 10 MG/ML IJ SOLN: 40.0000 mg | Freq: Two times a day (BID) | INTRAMUSCULAR | Status: DC

## 2019-07-20 MED ORDER — LABETALOL HCL 5 MG/ML IV SOLN: 10.0000 mg | INTRAVENOUS | Status: DC | PRN

## 2019-07-20 MED ORDER — STERILE WATER FOR INJECTION IJ SOLN
INTRAMUSCULAR | Status: AC
  Administered 2019-07-20: 10 mL
  Filled 2019-07-20: qty 10

## 2019-07-20 MED ORDER — FUROSEMIDE 10 MG/ML IJ SOLN
40.0000 mg | Freq: Once | INTRAMUSCULAR | Status: AC
  Administered 2019-07-20: 40 mg via INTRAVENOUS
  Filled 2019-07-20: qty 4

## 2019-07-20 NOTE — Progress Notes (Signed)
NAME:  Bruce Weaver, MRN:  [id number], DOB:  December 28, 1940, LOS: 34 ADMISSION DATE:  07/17/2019, CONSULTATION DATE:  07/05/19 REFERRING MD:  Lonny Prude, CHIEF COMPLAINT:  SOB   Brief History   79 year old man with metabolic syndrome presenting with severe COVID pneumonia.  Transferred from [place].  Diagnosed on 3/2.  Progressive hypoxemia and delirium prompting transfer to [facility] and ultimately intubation for ARDS.   Past Medical History  DM2 HTN HLD Obesity  Significant Hospital Events   3/1 admitted to [facility] 3/2 diagnosed with COVID 3/5 4L to 15L, transferred to [facility] 3/6 PCCM consult 07/07/2019 required intubation 07/07/2019 starting proning at 1600 hrs. 3/13 partial dislodgment of his arterial line-did have moderate bleeding 3/15 Transfer from [place] to [place], resumed proning 3/20 prone positioning; a fib with RVR >> start amiodarone  Consults:    Procedures:  3/09 intubation>> 3/10 right IJ CVL>> 3/10 Rt radial a line >>  Significant Diagnostic Tests:  CTA OSH neg for PE  Micro Data:  3/11 blood cultures x2>>No growth 3/11 sputum culture>>Enterobacter FAECALIS , ENTEROBACTER AEROGENES  3/11 Urine>> No Growth  Antimicrobials:  Remdesivir completed Ampicillin 3/16 >> Ceftriaxone 3/16 >>  Interim history/subjective:  Proned yesterday, supinated overnight. Increasing FiO2 since supinating.  Objective   Blood pressure (!) 131/52, pulse (!) 103, temperature (!) 96.3 F (35.7 C), resp. rate (!) 30, height 6' (1.829 m), weight 120 kg, SpO2 94 %. CVP:  [0 mmHg-22 mmHg] 13 mmHg  Vent Mode: PRVC FiO2 (%):  [70 %-100 %] 100 % Set Rate:  [30 bmp-34 bmp] 34 bmp Vt Set:  [460 mL] 460 mL PEEP:  [16 cmH20] 16 cmH20 Plateau Pressure:  [33 cmH20-38 cmH20] 38 cmH20   Intake/Output Summary (Last 24 hours) at 07/20/2019 1130 Last data filed at 07/20/2019 1100 Gross per 24 hour  Intake 4040.1 ml  Output 2160 ml  Net 1880.1 ml   Filed Weights   07/15/19 0432 07/16/19 0500 07/17/19 0700   Weight: 122.8 kg 120.1 kg 120 kg    Examination:  General -critically ill-appearing man intubated, sedated HENT -[place]/AT, eyes anicteric, ETT in place Cardiac -regular rate and rhythm, no murmurs Chest -clear bilaterally, thick secretions from ET tube, breathing synchronously with the vent. Plateau 38 Abdomen - obese, soft, NT Extremities -pitting edema in all extremities.  No cyanosis or clubbing Skin -no rashes or wounds Neuro - RASS -5, PERRL, + gag reflex   CXR 07/20/19- persistent bilateral infiltates  Assessment & Plan:   Acute hypoxic, hypercapnicrespiratory failure with ARDS in setting of COVID 19 pneumonia complicated by Enterobacter HCAP.  -Previously completed remdesivir and Decadron -Continue low tidal volume ventilation, 4 to 8 cc/kg ideal body weight with goal plateau less than 30 and driving pressure less than 15.  Not meeting goals due to high plateaus, but limited due to severe hypercapnia and acidosis. -Continue prone positioning for P:F less than 150 -As needed NMB for vent dyssynchrony causing desaturations -Prone again today for 16 hours-oxygen requirements and plateau pressures improved slightly with proning -Follow-up ABG - day 7/10 of ABx, currently on ampicillin and rocephin -Lasix 40 mg IV twice today, goal net negative fluid balance  Septic shock. -Titrate vasopressors as required to maintain MAP greater than 65  New onset A fib with RVR 3/20 - back in sinus rhythm 3/21 -No need for ongoing amiodarone currently  Acute metabolic encephalopathy 2nd to sepsis, hypoxia. - RASS goal -4 to tolerate proning  DM type 2 poorly controlled with hyperglycemia and  neuropathy. - SSI with tube feed coverage - increase levemir to 45 units bid - hold outpt glipizide, metformin, actos, januvia, lyrica  Nasal congestion - continue flonase, astelin  Anemia of critical illness -Serial CBCs -Transfuse for hemoglobin less than 7 or hemodynamically  significant  bleeding  Hx of HTN, HLD. - continue pravachol - hold outpt cozaar  AKI; unlikely prerenal with positive fluid balance and peripheral edema -Maintain euvolemia -Continue to monitor -Strict I/os -Renally dose medications -Maintain appropriate perfusion  Hyperkalemia -Lasix -Lokelma -Continue to monitor  Best practice:  Diet: Tube feeds per protocol DVT prophylaxis: lovenox GI prophylaxis: pepcid Mobility: BR Code Status: Full Disposition:  ICU  Family:  updated Vanessa over the phone  Labs:   CMP Latest Ref Rng & Units 07/20/2019 07/19/2019 07/19/2019  Glucose 70 - 99 mg/dL 252(H) 274(H) -  BUN 8 - 23 mg/dL 88(H) 61(H) -  Creatinine 0.61 - 1.24 mg/dL 1.72(H) 1.04 -  Sodium 135 - 145 mmol/L 141 143 141  Potassium 3.5 - 5.1 mmol/L 5.2(H) 4.6 4.5  Chloride 98 - 111 mmol/L 93(L) 97(L) -  CO2 22 - 32 mmol/L 36(H) 38(H) -  Calcium 8.9 - 10.3 mg/dL 8.9 8.7(L) -  Total Protein 6.5 - 8.1 g/dL - - -  Total Bilirubin 0.3 - 1.2 mg/dL - - -  Alkaline Phos 38 - 126 U/L - - -  AST 15 - 41 U/L - - -  ALT 0 - 44 U/L - - -    CBC Latest Ref Rng & Units 07/20/2019 07/19/2019 07/19/2019  WBC 4.0 - 10.5 K/uL 9.8 9.7 -  Hemoglobin 13.0 - 17.0 g/dL 9.1(L) 9.2(L) 9.5(L)  Hematocrit 39.0 - 52.0 % 32.0(L) 31.1(L) 28.0(L)  Platelets 150 - 400 K/uL 228 219 -    ABG    Component Value Date/Time   PHART 7.199 (LL) 07/20/2019 0625   PCO2ART 102 (HH) 07/20/2019 0625   PO2ART 57.9 (L) 07/20/2019 0625   HCO3 38.9 (H) 07/20/2019 0625   TCO2 47 (H) 07/19/2019 0415   O2SAT 88.2 07/20/2019 0625    CBG (last 3)  Recent Labs    07/19/19 2309 07/20/19 0319 07/20/19 0801  GLUCAP 267* 230* 190*     This patient is critically ill with multiple organ system failure which requires frequent high complexity decision making, assessment, support, evaluation, and titration of therapies. This was completed through the application of advanced monitoring technologies and extensive interpretation of  multiple databases. During this encounter critical care time was devoted to patient care services described in this note for 33 minutes.  Julian Hy, DO 07/20/19 11:30 AM [facility]

## 2019-07-20 NOTE — Progress Notes (Signed)
eLink Physician-Brief Progress Note Patient Name: Bruce Weaver DOB: 03-Jun-1940 MRN: [id number]   Date of Service  07/20/2019  HPI/Events of Note  Called regarding ABG:  ABG    Component Value Date/Time   PHART 7.184 (LL) 07/20/2019 0425   PCO2ART 107 (HH) 07/20/2019 0425   PO2ART 56.8 (L) 07/20/2019 0425   HCO3 39.2 (H) 07/20/2019 0425   TCO2 47 (H) 07/19/2019 0415   O2SAT 85.2 07/20/2019 0425   Patient has worsening hypoxemia and hypercarbic respiratory failure secondary to COVID.  They are currently intubated on:  Vent Mode: PRVC FiO2 (%):  [70 %-90 %] 90 % Set Rate:  [30 bmp] 30 bmp Vt Set:  [460 mL] 460 mL PEEP:  [16 cmH20] 16 cmH20 Plateau Pressure:  [33 cmH20-37 cmH20] 37 cmH20  They are receiving intermittent paralytics with vecuronium pushes.  They are currently synchronous with the ventilator.  They are over 1L net positive for fluid balance in the past 24 hours.   eICU Interventions  Ordered lasix 40mg  IV for net positive fluid balance and hypoxemia.  RT to increase FiO2 to 1.0. There is insufficient room to increase PEEP as per ARDSNet table as PPeak already 39 and VT is at 6cc/kg.  RT to increase RR 34.     Intervention Category Major Interventions: Acid-Base disturbance - evaluation and management;Respiratory failure - evaluation and management  Charlott Rakes 07/20/2019, 5:25 AM

## 2019-07-20 NOTE — Progress Notes (Signed)
Elink to facilitate videochat with family before proning patient.

## 2019-07-20 NOTE — Progress Notes (Signed)
Pt proned without any complications.  ETT patent & secure.

## 2019-07-20 NOTE — Progress Notes (Signed)
Assisted tele visit to patient with wife and daughter. [phone] was cell phone used for call. Maryelizabeth Rowan, RN

## 2019-07-20 NOTE — Plan of Care (Signed)
  Problem: Clinical Measurements: Goal: Will remain free from infection Outcome: Progressing Goal: Cardiovascular complication will be avoided Outcome: Progressing   Problem: Activity: Goal: Risk for activity intolerance will decrease Outcome: Progressing   Problem: Nutrition: Goal: Adequate nutrition will be maintained Outcome: Progressing   Problem: Coping: Goal: Level of anxiety will decrease Outcome: Progressing   Problem: Elimination: Goal: Will not experience complications related to bowel motility Outcome: Progressing Goal: Will not experience complications related to urinary retention Outcome: Progressing   Problem: Pain Managment: Goal: General experience of comfort will improve Outcome: Progressing   Problem: Safety: Goal: Ability to remain free from injury will improve Outcome: Progressing   Problem: Skin Integrity: Goal: Risk for impaired skin integrity will decrease Outcome: Progressing   Problem: Coping: Goal: Psychosocial and spiritual needs will be supported Outcome: Progressing   Problem: Respiratory: Goal: Will maintain a patent airway Outcome: Progressing Goal: Complications related to the disease process, condition or treatment will be avoided or minimized Outcome: Progressing   Problem: Education: Goal: Knowledge of risk factors and measures for prevention of condition will improve Outcome: Progressing   Problem: Coping: Goal: Psychosocial and spiritual needs will be supported Outcome: Progressing   Problem: Respiratory: Goal: Will maintain a patent airway Outcome: Progressing Goal: Complications related to the disease process, condition or treatment will be avoided or minimized Outcome: Progressing

## 2019-07-20 NOTE — Progress Notes (Signed)
Patient's head turned and arms repositioned without any complications.

## 2019-07-20 NOTE — Progress Notes (Signed)
CRITICAL VALUE ALERT  Critical Value:  7.184/107/56.8/39.2  Date & Time Notied:  07/20/2019 @ [id number]  Provider Notified: Warren Lacy MD  Orders Received/Actions taken: Warren Lacy MD made aware.

## 2019-07-20 NOTE — Progress Notes (Signed)
Pt's head turned to left and arms repositioned without any complications.

## 2019-07-20 NOTE — Progress Notes (Signed)
Patient placed back in supine position with no complications. ETT resecured with a tube holder and in the proper position.

## 2019-07-21 ENCOUNTER — Inpatient Hospital Stay (HOSPITAL_COMMUNITY): Payer: Medicare HMO

## 2019-07-21 DIAGNOSIS — D649 Anemia, unspecified: Secondary | ICD-10-CM

## 2019-07-21 DIAGNOSIS — J156 Pneumonia due to other aerobic Gram-negative bacteria: Secondary | ICD-10-CM

## 2019-07-21 LAB — CBC
HCT: 31.1 % — ABNORMAL LOW (ref 39.0–52.0)
Hemoglobin: 8.9 g/dL — ABNORMAL LOW (ref 13.0–17.0)
MCH: 30.2 pg (ref 26.0–34.0)
MCHC: 28.6 g/dL — ABNORMAL LOW (ref 30.0–36.0)
MCV: 105.4 fL — ABNORMAL HIGH (ref 80.0–100.0)
Platelets: 231 10*3/uL (ref 150–400)
RBC: 2.95 MIL/uL — ABNORMAL LOW (ref 4.22–5.81)
RDW: 15.1 % (ref 11.5–15.5)
WBC: 8.4 10*3/uL (ref 4.0–10.5)
nRBC: 3.6 % — ABNORMAL HIGH (ref 0.0–0.2)

## 2019-07-21 LAB — POCT I-STAT 7, (LYTES, BLD GAS, ICA,H+H)
Acid-Base Excess: 11 mmol/L — ABNORMAL HIGH (ref 0.0–2.0)
Acid-Base Excess: 10 mmol/L — ABNORMAL HIGH (ref 0.0–2.0)
Acid-Base Excess: 11 mmol/L — ABNORMAL HIGH (ref 0.0–2.0)
Bicarbonate: 40.6 mmol/L — ABNORMAL HIGH (ref 20.0–28.0)
Bicarbonate: 38.9 mmol/L — ABNORMAL HIGH (ref 20.0–28.0)
Bicarbonate: 39.1 mmol/L — ABNORMAL HIGH (ref 20.0–28.0)
Calcium, Ion: 1.18 mmol/L (ref 1.15–1.40)
Calcium, Ion: 1.14 mmol/L — ABNORMAL LOW (ref 1.15–1.40)
Calcium, Ion: 1.17 mmol/L (ref 1.15–1.40)
HCT: 27 % — ABNORMAL LOW (ref 39.0–52.0)
HCT: 27 % — ABNORMAL LOW (ref 39.0–52.0)
HCT: 27 % — ABNORMAL LOW (ref 39.0–52.0)
Hemoglobin: 9.2 g/dL — ABNORMAL LOW (ref 13.0–17.0)
Hemoglobin: 9.2 g/dL — ABNORMAL LOW (ref 13.0–17.0)
Hemoglobin: 9.2 g/dL — ABNORMAL LOW (ref 13.0–17.0)
O2 Saturation: 92 %
O2 Saturation: 88 %
O2 Saturation: 88 %
Patient temperature: 35.9
Potassium: 4.5 mmol/L (ref 3.5–5.1)
Potassium: 4.2 mmol/L (ref 3.5–5.1)
Potassium: 4.2 mmol/L (ref 3.5–5.1)
Sodium: 138 mmol/L (ref 135–145)
Sodium: 139 mmol/L (ref 135–145)
Sodium: 139 mmol/L (ref 135–145)
TCO2: 43 mmol/L — ABNORMAL HIGH (ref 22–32)
TCO2: 41 mmol/L — ABNORMAL HIGH (ref 22–32)
TCO2: 42 mmol/L — ABNORMAL HIGH (ref 22–32)
pCO2 arterial: 87.6 mmHg (ref 32.0–48.0)
pCO2 arterial: 80 mmHg (ref 32.0–48.0)
pCO2 arterial: 80.2 mmHg (ref 32.0–48.0)
pH, Arterial: 7.268 — ABNORMAL LOW (ref 7.350–7.450)
pH, Arterial: 7.295 — ABNORMAL LOW (ref 7.350–7.450)
pH, Arterial: 7.296 — ABNORMAL LOW (ref 7.350–7.450)
pO2, Arterial: 73 mmHg — ABNORMAL LOW (ref 83.0–108.0)
pO2, Arterial: 64 mmHg — ABNORMAL LOW (ref 83.0–108.0)
pO2, Arterial: 63 mmHg — ABNORMAL LOW (ref 83.0–108.0)

## 2019-07-21 LAB — URINALYSIS, ROUTINE W REFLEX MICROSCOPIC
Bilirubin Urine: NEGATIVE
Bilirubin Urine: NEGATIVE
Glucose, UA: NEGATIVE mg/dL
Glucose, UA: NEGATIVE mg/dL
Ketones, ur: NEGATIVE mg/dL
Ketones, ur: NEGATIVE mg/dL
Leukocytes,Ua: NEGATIVE
Nitrite: NEGATIVE
Nitrite: NEGATIVE
Protein, ur: 30 mg/dL — AB
Protein, ur: NEGATIVE mg/dL
RBC / HPF: >50 RBC/hpf — ABNORMAL HIGH (ref 0–5)
Specific Gravity, Urine: 1.014 (ref 1.005–1.030)
Specific Gravity, Urine: 1.012 (ref 1.005–1.030)
WBC, UA: >50 WBC/hpf — ABNORMAL HIGH (ref 0–5)
pH: 5 (ref 5.0–8.0)
pH: 5 (ref 5.0–8.0)

## 2019-07-21 LAB — CREATININE, URINE, RANDOM: Creatinine, Urine: 36.13 mg/dL

## 2019-07-21 LAB — BASIC METABOLIC PANEL
Anion gap: 11 (ref 5–15)
BUN: 114 mg/dL — ABNORMAL HIGH (ref 8–23)
CO2: 37 mmol/L — ABNORMAL HIGH (ref 22–32)
Calcium: 8.7 mg/dL — ABNORMAL LOW (ref 8.9–10.3)
Chloride: 92 mmol/L — ABNORMAL LOW (ref 98–111)
Creatinine, Ser: 2.18 mg/dL — ABNORMAL HIGH (ref 0.61–1.24)
GFR calc Af Amer: 33 mL/min — ABNORMAL LOW (ref >60)
GFR calc non Af Amer: 28 mL/min — ABNORMAL LOW (ref >60)
Glucose, Bld: 167 mg/dL — ABNORMAL HIGH (ref 70–99)
Potassium: 4.9 mmol/L (ref 3.5–5.1)
Sodium: 140 mmol/L (ref 135–145)

## 2019-07-21 LAB — GLUCOSE, CAPILLARY
Glucose-Capillary: 209 mg/dL — ABNORMAL HIGH (ref 70–99)
Glucose-Capillary: 120 mg/dL — ABNORMAL HIGH (ref 70–99)
Glucose-Capillary: 101 mg/dL — ABNORMAL HIGH (ref 70–99)
Glucose-Capillary: 123 mg/dL — ABNORMAL HIGH (ref 70–99)
Glucose-Capillary: 112 mg/dL — ABNORMAL HIGH (ref 70–99)
Glucose-Capillary: 81 mg/dL (ref 70–99)
Glucose-Capillary: 90 mg/dL (ref 70–99)

## 2019-07-21 LAB — MAGNESIUM: Magnesium: 2.7 mg/dL — ABNORMAL HIGH (ref 1.7–2.4)

## 2019-07-21 LAB — SODIUM, URINE, RANDOM: Sodium, Ur: 29 mmol/L

## 2019-07-21 MED ORDER — FUROSEMIDE 10 MG/ML IJ SOLN
60.0000 mg | Freq: Two times a day (BID) | INTRAMUSCULAR | Status: AC
  Administered 2019-07-21 (×2): 60 mg via INTRAVENOUS
  Filled 2019-07-21 (×2): qty 6

## 2019-07-21 MED ORDER — IPRATROPIUM-ALBUTEROL 0.5-2.5 (3) MG/3ML IN SOLN
3.0000 mL | Freq: Four times a day (QID) | RESPIRATORY_TRACT | Status: DC
  Administered 2019-07-21 – 2019-07-23 (×8): 3 mL via RESPIRATORY_TRACT
  Filled 2019-07-21 (×6): qty 3

## 2019-07-21 NOTE — Progress Notes (Signed)
CRITICAL VALUE ALERT  Critical Value:  ABG 7.268/87.6/73/40.6  Date & Time Notied:  07/21/2019 @ [id number]  Provider Notified: Warren Lacy MD  Orders Received/Actions taken: RT also made aware

## 2019-07-21 NOTE — Progress Notes (Signed)
Patient's head turned and arms repositioned without any complications.

## 2019-07-21 NOTE — Progress Notes (Signed)
Pt proned. ETT re-taped with clothtape. Mepalex applied to skin before re-taping. Arms re-positioned. No complications.

## 2019-07-21 NOTE — Progress Notes (Signed)
NAME:  Bruce Weaver, MRN:  [id number], DOB:  02/01/1941, LOS: 43 ADMISSION DATE:  07/17/2019, CONSULTATION DATE:  07/05/19 REFERRING MD:  Lonny Prude, CHIEF COMPLAINT:  SOB   Brief History   79 year old man with metabolic syndrome presenting with severe COVID pneumonia.  Transferred from [place].  Diagnosed on 3/2.  Progressive hypoxemia and delirium prompting transfer to [facility] and ultimately intubation for ARDS.   Past Medical History  DM2 HTN HLD Obesity  Significant Hospital Events   3/1 admitted to [facility] 3/2 diagnosed with COVID 3/5 4L to 15L, transferred to [facility] 3/6 PCCM consult 07/07/2019 required intubation 07/07/2019 starting proning at 1600 hrs. 3/13 partial dislodgment of his arterial line-did have moderate bleeding 3/15 Transfer from [place] to [place], resumed proning 3/20 prone positioning; a fib with RVR >> start amiodarone  Consults:    Procedures:  3/09 intubation>> 3/10 right IJ CVL>> 3/10 Rt radial a line >>  Significant Diagnostic Tests:  CTA OSH neg for PE  Micro Data:  3/11 blood cultures x2>>No growth 3/11 sputum culture>>Enterobacter FAECALIS , ENTEROBACTER AEROGENES  3/11 Urine>> No Growth  Antimicrobials:  Remdesivir completed Ampicillin 3/16 >> Ceftriaxone 3/16 >>  Interim history/subjective:  Supinated overnight. Still having significant secretions.  Objective   Blood pressure 127/64, pulse 100, temperature (!) 96.6 F (35.9 C), resp. rate (!) 35, height 6' (1.829 m), weight 120 kg, SpO2 98 %. CVP:  [13 mmHg-14 mmHg] 13 mmHg  Vent Mode: PRVC FiO2 (%):  [100 %] 100 % Set Rate:  [34 bmp-35 bmp] 35 bmp Vt Set:  [460 mL] 460 mL PEEP:  [16 cmH20] 16 cmH20 Plateau Pressure:  [35 cmH20-38 cmH20] 38 cmH20   Intake/Output Summary (Last 24 hours) at 07/21/2019 0838 Last data filed at 07/21/2019 0600 Gross per 24 hour  Intake 2885.3 ml  Output 2445 ml  Net 440.3 ml   Filed Weights   07/15/19 0432 07/16/19 0500 07/17/19 0700  Weight: 122.8 kg 120.1 kg  120 kg    Examination:  General: Critically ill-appearing man intubated, sedated HENT: [place]/ AT, eyes anicteric, oral mucosa moist Cardiac: Regular rate and rhythm, no murmurs Chest-rales bilaterally, tachypneic occasionally breath stacking but mostly synchronous with the vent.  Mild air trapping on the vent, plateau pressure 38, driving pressure 22.  Sick tan secretions from ET tube Abdomen -obese, soft, nontender Extremities -pitting edema in all extremities.  Right radial A-line with good distal perfusion. Skin - scabs on lips, no rashes Neuro - RASS -5, more tachypneic and hypertensive during exam, but not moving extremities.   CXR 3/23 pending  Assessment & Plan:   Acute hypoxic, hypercapnicrespiratory failure with ARDS in setting of COVID 19 pneumonia complicated by Enterobacter HAP.  Significantly positive fluid balance this admission. -Previously completed remdesivir and Decadron -Continue low tidal volume ventilation, 4 to 8 cc/kg ideal body weight with goal plateau less than 30 and driving pressure less than 15-20.  Not currently meeting his goals due to hypercapnia limiting down titration of tidal volumes, although pressures improved after changing HME. -Continue prone ventilation for 16 hours/day until P:F > 150 -As needed neuromuscular blockade for vent dyssynchrony causing desaturations -ABG pre and 2h post prone today -Day 8/10 of ABx, currently on ampicillin and rocephin with persistent thick purulent secretions.  Recultured today. -Lasix 60 mg IV twice today, goal net negative fluid balance -Adding DuoNebs given obstruction on waveforms -CXR pending  Septic shock -Continue vasopressors as required to maintain MAP greater than 65 -Continue antibiotics  New onset A fib with RVR 3/20.  Back in sinus rhythm since 3/21 -Continue telemetry monitoring -No need for ongoing amiodarone  Acute metabolic encephalopathy 2nd to sepsis, hypoxia. -Sedation with goal RASS goal -4  to -5 tolerate proning.  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  Hyperglycemia improving -Continue every 4 hours Accu-Cheks with sliding scale insulin -Continue detemir twice daily -Continue to hold outpatient glipizide, Metformin, Actos, Januvia, and Lyrica We will BG 140-180 while admitted to the ICU  Nasal congestion -Continue Flonase and azelastine nasal sprays  Anemia of critical illness, stable -Serial CBCs -Transfuse for hemoglobin less than 7 or hemodynamically significant bleeding  Hx of HTN, HLD -Continue Pravachol -Holding PTA antihypertensives  AKI; unlikely prerenal with positive fluid balance and peripheral edema -UA, urine sodium and creatinine, urine eos -Continue diuresis with goal of euvolemia -Strict I's/O -Renally dose medications -Maintain appropriate perfusion  Hyperkalemia-resolved -Continue to monitor  Best practice:  Diet: Tube feeds per protocol DVT prophylaxis: lovenox GI prophylaxis: pepcid Mobility: BR Code Status: Full Disposition:  ICU  Family:  updated Vanessa over the phone 3/23  Labs:   CMP Latest Ref Rng & Units 07/21/2019 07/21/2019 07/20/2019  Glucose 70 - 99 mg/dL - 167(H) 252(H)  BUN 8 - 23 mg/dL - 114(H) 88(H)  Creatinine 0.61 - 1.24 mg/dL - 2.18(H) 1.72(H)  Sodium 135 - 145 mmol/L 138 140 141  Potassium 3.5 - 5.1 mmol/L 4.5 4.9 5.2(H)  Chloride 98 - 111 mmol/L - 92(L) 93(L)  CO2 22 - 32 mmol/L - 37(H) 36(H)  Calcium 8.9 - 10.3 mg/dL - 8.7(L) 8.9  Total Protein 6.5 - 8.1 g/dL - - -  Total Bilirubin 0.3 - 1.2 mg/dL - - -  Alkaline Phos 38 - 126 U/L - - -  AST 15 - 41 U/L - - -  ALT 0 - 44 U/L - - -    CBC Latest Ref Rng & Units 07/21/2019 07/21/2019 07/20/2019  WBC 4.0 - 10.5 K/uL - 8.4 9.8  Hemoglobin 13.0 - 17.0 g/dL 9.2(L) 8.9(L) 9.1(L)  Hematocrit 39.0 - 52.0 % 27.0(L) 31.1(L) 32.0(L)  Platelets 150 - 400 K/uL - 231 228    ABG    Component Value Date/Time   PHART 7.268 (L) 07/21/2019 0603    PCO2ART 87.6 (HH) 07/21/2019 0603   PO2ART 73.0 (L) 07/21/2019 0603   HCO3 40.6 (H) 07/21/2019 0603   TCO2 43 (H) 07/21/2019 0603   O2SAT 92.0 07/21/2019 0603    CBG (last 3)  Recent Labs    07/21/19 0057 07/21/19 0335 07/21/19 0752  GLUCAP 209* 120* 101*     This patient is critically ill with multiple organ system failure which requires frequent high complexity decision making, assessment, support, evaluation, and titration of therapies. This was completed through the application of advanced monitoring technologies and extensive interpretation of multiple databases. During this encounter critical care time was devoted to patient care services described in this note for 40 minutes.  Julian Hy, DO 07/21/19 8:41 AM [facility]

## 2019-07-21 NOTE — Progress Notes (Signed)
eLink Physician-Brief Progress Note Patient Name: Bruce Weaver DOB: 05-Sep-1940 MRN: [id number]   Date of Service  07/21/2019  HPI/Events of Note  7.26/ 87.6/ 73  eICU Interventions  RR increased to 35        [name] [name] 07/21/2019, 6:22 AM

## 2019-07-21 NOTE — Progress Notes (Signed)
Patient placed back in supine position with no complications. ETT still secure and in proper position.

## 2019-07-22 ENCOUNTER — Inpatient Hospital Stay (HOSPITAL_COMMUNITY): Payer: Medicare HMO

## 2019-07-22 DIAGNOSIS — J150 Pneumonia due to Klebsiella pneumoniae: Secondary | ICD-10-CM

## 2019-07-22 LAB — GLUCOSE, CAPILLARY
Glucose-Capillary: 100 mg/dL — ABNORMAL HIGH (ref 70–99)
Glucose-Capillary: 102 mg/dL — ABNORMAL HIGH (ref 70–99)
Glucose-Capillary: 150 mg/dL — ABNORMAL HIGH (ref 70–99)
Glucose-Capillary: 192 mg/dL — ABNORMAL HIGH (ref 70–99)
Glucose-Capillary: 245 mg/dL — ABNORMAL HIGH (ref 70–99)
Glucose-Capillary: 47 mg/dL — ABNORMAL LOW (ref 70–99)
Glucose-Capillary: 80 mg/dL (ref 70–99)
Glucose-Capillary: 97 mg/dL (ref 70–99)

## 2019-07-22 LAB — POCT I-STAT 7, (LYTES, BLD GAS, ICA,H+H)
Acid-Base Excess: 12 mmol/L — ABNORMAL HIGH (ref 0.0–2.0)
Bicarbonate: 41 mmol/L — ABNORMAL HIGH (ref 20.0–28.0)
Calcium, Ion: 1.19 mmol/L (ref 1.15–1.40)
HCT: 26 % — ABNORMAL LOW (ref 39.0–52.0)
Hemoglobin: 8.8 g/dL — ABNORMAL LOW (ref 13.0–17.0)
O2 Saturation: 85 %
Patient temperature: 97.3
Potassium: 4 mmol/L (ref 3.5–5.1)
Sodium: 140 mmol/L (ref 135–145)
TCO2: 44 mmol/L — ABNORMAL HIGH (ref 22–32)
pCO2 arterial: 82.9 mmHg (ref 32.0–48.0)
pH, Arterial: 7.299 — ABNORMAL LOW (ref 7.350–7.450)
pO2, Arterial: 57 mmHg — ABNORMAL LOW (ref 83.0–108.0)

## 2019-07-22 LAB — COMPREHENSIVE METABOLIC PANEL
ALT: 33 U/L (ref 0–44)
AST: 36 U/L (ref 15–41)
Albumin: 1.2 g/dL — ABNORMAL LOW (ref 3.5–5.0)
Alkaline Phosphatase: 151 U/L — ABNORMAL HIGH (ref 38–126)
Anion gap: 11 (ref 5–15)
BUN: 131 mg/dL — ABNORMAL HIGH (ref 8–23)
CO2: 35 mmol/L — ABNORMAL HIGH (ref 22–32)
Calcium: 8.1 mg/dL — ABNORMAL LOW (ref 8.9–10.3)
Chloride: 95 mmol/L — ABNORMAL LOW (ref 98–111)
Creatinine, Ser: 2.34 mg/dL — ABNORMAL HIGH (ref 0.61–1.24)
GFR calc Af Amer: 30 mL/min — ABNORMAL LOW (ref >60)
GFR calc non Af Amer: 26 mL/min — ABNORMAL LOW (ref >60)
Glucose, Bld: 120 mg/dL — ABNORMAL HIGH (ref 70–99)
Potassium: 4.1 mmol/L (ref 3.5–5.1)
Sodium: 141 mmol/L (ref 135–145)
Total Bilirubin: 0.4 mg/dL (ref 0.3–1.2)
Total Protein: 5.6 g/dL — ABNORMAL LOW (ref 6.5–8.1)

## 2019-07-22 LAB — MAGNESIUM: Magnesium: 2.6 mg/dL — ABNORMAL HIGH (ref 1.7–2.4)

## 2019-07-22 LAB — CYTOLOGY - NON PAP

## 2019-07-22 LAB — PHOSPHORUS: Phosphorus: 6.4 mg/dL — ABNORMAL HIGH (ref 2.5–4.6)

## 2019-07-22 MED ORDER — SODIUM CHLORIDE 0.9 % IV SOLN
2.0000 g | Freq: Two times a day (BID) | INTRAVENOUS | Status: DC
  Administered 2019-07-22 – 2019-07-23 (×3): 2 g via INTRAVENOUS
  Filled 2019-07-22 (×4): qty 2

## 2019-07-22 MED ORDER — POLYETHYLENE GLYCOL 3350 17 G PO PACK
17.0000 g | PACK | Freq: Every day | ORAL | Status: DC | PRN
  Administered 2019-07-22: 17 g

## 2019-07-22 MED ORDER — FUROSEMIDE 10 MG/ML IJ SOLN
60.0000 mg | Freq: Four times a day (QID) | INTRAMUSCULAR | Status: AC
  Administered 2019-07-22 (×3): 60 mg via INTRAVENOUS
  Filled 2019-07-22 (×3): qty 6

## 2019-07-22 MED ORDER — AMIODARONE LOAD VIA INFUSION
150.0000 mg | Freq: Once | INTRAVENOUS | Status: DC
  Filled 2019-07-22: qty 83.34

## 2019-07-22 MED ORDER — INSULIN ASPART 100 UNIT/ML ~~LOC~~ SOLN
4.0000 [IU] | SUBCUTANEOUS | Status: DC
  Administered 2019-07-22 – 2019-07-23 (×6): 4 [IU] via SUBCUTANEOUS

## 2019-07-22 MED ORDER — AMIODARONE HCL IN DEXTROSE 360-4.14 MG/200ML-% IV SOLN: 30.0000 mg/h | INTRAVENOUS | Status: DC

## 2019-07-22 MED ORDER — AMIODARONE HCL IN DEXTROSE 360-4.14 MG/200ML-% IV SOLN
60.0000 mg/h | INTRAVENOUS | Status: AC
  Filled 2019-07-22: qty 200

## 2019-07-22 MED ORDER — DEXTROSE 50 % IV SOLN
INTRAVENOUS | Status: AC
  Administered 2019-07-22: 50 mL
  Filled 2019-07-22: qty 50

## 2019-07-22 NOTE — Progress Notes (Signed)
Pt proned.  ETT secured with cloth tape at center.  No complications.

## 2019-07-22 NOTE — Progress Notes (Signed)
Inpatient Diabetes Program Recommendations  AACE/ADA: New Consensus Statement on Inpatient Glycemic Control (2015)  Target Ranges:  Prepandial:   less than 140 mg/dL      Peak postprandial:   less than 180 mg/dL (1-2 hours)      Critically ill patients:  140 - 180 mg/dL   Lab Results  Component Value Date   GLUCAP 102 (H) 07/22/2019   HGBA1C 8.0 (H) 07/29/2019    Review of Glycemic Control Results for BRILYN, KISE (MRN [id number]) as of 07/22/2019 09:56  Ref. Range 07/21/2019 11:32 07/21/2019 15:38 07/21/2019 20:00 07/21/2019 20:12 07/22/2019 00:34 07/22/2019 03:24 07/22/2019 07:47 07/22/2019 08:49  Glucose-Capillary Latest Ref Range: 70 - 99 mg/dL 123 (H) 112 (H) 81 90 100 (H) 80 47 (L) 102 (H)   Diabetes history: DM 2 Outpatient Diabetes medications: Glipizide 10 mg bid, Metformin 1000 mg bid, Actos 45 mg Daily, Januvia 100 mg Daily Current orders for Inpatient glycemic control:  Levemir 45 units bid Novolog 0-15 units q4 hours Novolog 4 units q4 hours  Vital 1.5 Tube Feeds 45 ml/hour  Inpatient Diabetes Program Recommendations:    Hypoglycemia 47 this am.  Consider decreasing Levemir to 40 units bid.  Thanks,  Tama Headings RN, MSN, BC-ADM Inpatient Diabetes Coordinator Team Pager [phone] (8a-5p)

## 2019-07-22 NOTE — Progress Notes (Signed)
Patient was supine at 0500 and needed to be proned at 1115 to maintain O2 sats >88%.   Wife, Bruce Weaver, was updated on plan of care and events from today. All questions were answered at this time.

## 2019-07-22 NOTE — Progress Notes (Signed)
NAME:  Bruce Weaver, MRN:  [id number], DOB:  June 30, 1940, LOS: 37 ADMISSION DATE:  07/26/2019, CONSULTATION DATE:  07/05/19 REFERRING MD:  Lonny Prude, CHIEF COMPLAINT:  SOB   Brief History   79 year old man with metabolic syndrome presenting with severe COVID pneumonia.  Transferred from [place].  Diagnosed on 3/2.  Progressive hypoxemia and delirium prompting transfer to [facility] and ultimately intubation for ARDS.   Past Medical History  DM2 HTN HLD Obesity  Significant Hospital Events   3/1 admitted to [facility] 3/2 diagnosed with COVID 3/5 4L to 15L, transferred to [facility] 3/6 PCCM consult 07/07/2019 required intubation 07/07/2019 starting proning at 1600 hrs. 3/13 partial dislodgment of his arterial line-did have moderate bleeding 3/15 Transfer from [place] to [place], resumed proning 3/20 prone positioning; a fib with RVR >> start amiodarone  Consults:    Procedures:  3/09 intubation>> 3/10 right IJ CVL>> 3/10 Rt radial a line >>  Significant Diagnostic Tests:  CTA OSH neg for PE  Micro Data:  3/11 blood cultures x2>>No growth 3/11 sputum culture>>Enterobacter FAECALIS , ENTEROBACTER AEROGENES  3/11 Urine>> No Growth 3/23 sputum >> Klebsiella oxytoca>>  Antimicrobials:  Remdesivir completed Ampicillin 3/16 >>3/23 Ceftriaxone 3/16 >>3/23 Meropenem 3/24>>  Interim history/subjective:  Proned overnight. Requiring 1 dose of vecuronium for vent dyssynchrony this morning.  Objective   Blood pressure (!) 124/46, pulse (!) 107, temperature 97.8 F (36.6 C), temperature source Oral, resp. rate (!) 35, height 6' (1.829 m), weight 126 kg, SpO2 95 %.    Vent Mode: PRVC FiO2 (%):  [90 %-100 %] 100 % Set Rate:  [35 bmp] 35 bmp Vt Set:  [460 mL] 460 mL PEEP:  [16 cmH20] 16 cmH20 Plateau Pressure:  [26 cmH20-37 cmH20] 35 cmH20   Intake/Output Summary (Last 24 hours) at 07/22/2019 1029 Last data filed at 07/22/2019 0900 Gross per 24 hour  Intake 2833.44 ml  Output 2425 ml  Net 408.44 ml     Filed Weights   07/16/19 0500 07/17/19 0700 07/22/19 0500  Weight: 120.1 kg 120 kg 126 kg    Examination: General: critically-ill appearing man intubated, sedated, on NMB HENT: /AT, eyes anicteric, ETT, OGT Cardiac: tachycardic, regular rate, no murmurs Resp: rhales bilaterally, wheezing on the right, breathing synchronously with the vent. P plat 35. No significant ETT secretions. Abdomen: obese, soft, NT Extremities: pitting edema in all extremities, no clubbing or cyanosis. R radial A-line with good peripheral perfusion. Skin - no rashes or wounds Neuro - RASS -5, under NMB, PERRL    Assessment & Plan:   Acute hypoxic, hypercapnicrespiratory failure with ARDS in setting of COVID 19 pneumonia complicated by Enterobacter HAP.  Significantly positive fluid balance this admission. Now with Klebsiella pneumonia. -Previously completed remdesivir and Decadron -Continue low tidal volume ventilation, 4 to 8 cc/kg ideal body weight with goal plateau less than 30 and driving pressure of [id number].  Not currently meeting goals.  Titrate PEEP and FiO2 per ARDS ladder.  Limited due to high plateau pressures.  Permissive hypercapnia okay. -Continue prone ventilation for 16 hours/day until P:F > 150 -PRN NMB for significant ventilator dyssynchrony -ABG pre and 2h post prone today -Escalating antibiotics to meropenem. -Lasix 60 mg IV for 3 doses today, goal net negative fluid balance -Continue DuoNebs -CXR  Septic shock -Continue vasopressors as required to maintain MAP greater than 65 -Continue antibiotics-escalating to meropenem  New onset A fib with RVR 3/20.  Back in sinus rhythm since 3/21 -Continue telemetry monitoring -No need for ongoing  amiodarone  Acute metabolic encephalopathy 2nd to sepsis, hypoxia. 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  Now with hypoglycemia. -Continue every 4 hours  Accu-Cheks with sliding scale insulin -Holding morning detemir. -Decreasing tube feeding coverage aspart Q4h with hold parameters -Continue to hold outpatient glipizide, Metformin, Actos, Januvia, and Lyrica -Goal blood glucose 140-180 while admitted to the ICU  Nasal congestion -Continue Flonase and azelastine nasal sprays  Anemia of critical illness, stable Continue to monitor -Transfuse for hemoglobin less than 7 or hemodynamically significant bleeding.  Hx of HTN, HLD -Continue PTA pravastatin -Continue to hold PTA antihypertensive  AKI-possibly post renal due to Foley catheter obstruction. unlikely prerenal with positive fluid balance and peripheral edema.  UA on 3/20.  Post Foley catheter change does not appear infected, no proteinuria. -Strict I/Os -Continue diuresis with goal of euvolemia -Renally dose medications -Maintain appropriate perfusion -Urine eosinophils pending  Hyperkalemia-resolved -Continue to monitor  Best practice:  Diet: Tube feeds per protocol DVT prophylaxis: lovenox GI prophylaxis: pepcid Mobility: BR Code Status: Full Disposition:  ICU  Family:  updated Vanessa over the phone 3/24  Labs:   CMP Latest Ref Rng & Units 07/22/2019 07/22/2019 07/21/2019  Glucose 70 - 99 mg/dL 120(H) - -  BUN 8 - 23 mg/dL 131(H) - -  Creatinine 0.61 - 1.24 mg/dL 2.34(H) - -  Sodium 135 - 145 mmol/L 141 140 139  Potassium 3.5 - 5.1 mmol/L 4.1 4.0 4.2  Chloride 98 - 111 mmol/L 95(L) - -  CO2 22 - 32 mmol/L 35(H) - -  Calcium 8.9 - 10.3 mg/dL 8.1(L) - -  Total Protein 6.5 - 8.1 g/dL 5.6(L) - -  Total Bilirubin 0.3 - 1.2 mg/dL 0.4 - -  Alkaline Phos 38 - 126 U/L 151(H) - -  AST 15 - 41 U/L 36 - -  ALT 0 - 44 U/L 33 - -    CBC Latest Ref Rng & Units 07/22/2019 07/21/2019 07/21/2019  WBC 4.0 - 10.5 K/uL - - -  Hemoglobin 13.0 - 17.0 g/dL 8.8(L) 9.2(L) 9.2(L)  Hematocrit 39.0 - 52.0 % 26.0(L) 27.0(L) 27.0(L)  Platelets 150 - 400 K/uL - - -    ABG    Component  Value Date/Time   PHART 7.299 (L) 07/22/2019 0641   PCO2ART 82.9 (HH) 07/22/2019 0641   PO2ART 57.0 (L) 07/22/2019 0641   HCO3 41.0 (H) 07/22/2019 0641   TCO2 44 (H) 07/22/2019 0641   O2SAT 85.0 07/22/2019 0641    CBG (last 3)  Recent Labs    07/22/19 0324 07/22/19 0747 07/22/19 0849  GLUCAP 80 47* 102*     This patient is critically ill with multiple organ system failure which requires frequent high complexity decision making, assessment, support, evaluation, and titration of therapies. This was completed through the application of advanced monitoring technologies and extensive interpretation of multiple databases. During this encounter critical care time was devoted to patient care services described in this note for 38 minutes.  Julian Hy, DO 07/22/19 10:29 AM [facility]

## 2019-07-22 NOTE — Progress Notes (Signed)
PT turned back to supine, with no complications. SATs remain 90-92. Will continue to monitor.

## 2019-07-23 DIAGNOSIS — A414 Sepsis due to anaerobes: Secondary | ICD-10-CM

## 2019-07-23 DIAGNOSIS — Z7189 Other specified counseling: Secondary | ICD-10-CM

## 2019-07-23 LAB — BASIC METABOLIC PANEL
Anion gap: 12 (ref 5–15)
BUN: 148 mg/dL — ABNORMAL HIGH (ref 8–23)
CO2: 37 mmol/L — ABNORMAL HIGH (ref 22–32)
Calcium: 8.6 mg/dL — ABNORMAL LOW (ref 8.9–10.3)
Chloride: 93 mmol/L — ABNORMAL LOW (ref 98–111)
Creatinine, Ser: 2.51 mg/dL — ABNORMAL HIGH (ref 0.61–1.24)
GFR calc Af Amer: 28 mL/min — ABNORMAL LOW (ref >60)
GFR calc non Af Amer: 24 mL/min — ABNORMAL LOW (ref >60)
Glucose, Bld: 245 mg/dL — ABNORMAL HIGH (ref 70–99)
Potassium: 4 mmol/L (ref 3.5–5.1)
Sodium: 142 mmol/L (ref 135–145)

## 2019-07-23 LAB — CBC
HCT: 28.3 % — ABNORMAL LOW (ref 39.0–52.0)
Hemoglobin: 8.1 g/dL — ABNORMAL LOW (ref 13.0–17.0)
MCH: 31 pg (ref 26.0–34.0)
MCHC: 28.6 g/dL — ABNORMAL LOW (ref 30.0–36.0)
MCV: 108.4 fL — ABNORMAL HIGH (ref 80.0–100.0)
Platelets: 209 10*3/uL (ref 150–400)
RBC: 2.61 MIL/uL — ABNORMAL LOW (ref 4.22–5.81)
RDW: 15.9 % — ABNORMAL HIGH (ref 11.5–15.5)
WBC: 6.8 10*3/uL (ref 4.0–10.5)
nRBC: 6.2 % — ABNORMAL HIGH (ref 0.0–0.2)

## 2019-07-23 LAB — BLOOD GAS, ARTERIAL
Acid-Base Excess: 9 mmol/L — ABNORMAL HIGH (ref 0.0–2.0)
Bicarbonate: 37.4 mmol/L — ABNORMAL HIGH (ref 20.0–28.0)
Drawn by: 560371
FIO2: 100
O2 Saturation: 91.3 %
Patient temperature: 96.4
pCO2 arterial: 106 mmHg (ref 32.0–48.0)
pH, Arterial: 7.174 — CL (ref 7.350–7.450)
pO2, Arterial: 76.3 mmHg — ABNORMAL LOW (ref 83.0–108.0)

## 2019-07-23 LAB — GLUCOSE, CAPILLARY
Glucose-Capillary: 262 mg/dL — ABNORMAL HIGH (ref 70–99)
Glucose-Capillary: 185 mg/dL — ABNORMAL HIGH (ref 70–99)
Glucose-Capillary: 197 mg/dL — ABNORMAL HIGH (ref 70–99)

## 2019-07-23 MED ORDER — GLYCOPYRROLATE 0.2 MG/ML IJ SOLN: 0.2000 mg | INTRAMUSCULAR | Status: DC | PRN

## 2019-07-23 MED ORDER — ACETAMINOPHEN 325 MG PO TABS: 650.0000 mg | ORAL_TABLET | Freq: Four times a day (QID) | ORAL | Status: DC | PRN

## 2019-07-23 MED ORDER — ACETAMINOPHEN 650 MG RE SUPP: 650.0000 mg | Freq: Four times a day (QID) | RECTAL | Status: DC | PRN

## 2019-07-23 MED ORDER — MIDAZOLAM HCL 2 MG/2ML IJ SOLN: 1.0000 mg | INTRAMUSCULAR | Status: DC | PRN

## 2019-07-23 MED ORDER — POLYVINYL ALCOHOL 1.4 % OP SOLN
1.0000 [drp] | Freq: Four times a day (QID) | OPHTHALMIC | Status: DC | PRN
  Filled 2019-07-23: qty 15

## 2019-07-23 MED ORDER — DIPHENHYDRAMINE HCL 50 MG/ML IJ SOLN: 25.0000 mg | INTRAMUSCULAR | Status: DC | PRN

## 2019-07-23 MED ORDER — GLYCOPYRROLATE 1 MG PO TABS: 1.0000 mg | ORAL_TABLET | ORAL | Status: DC | PRN

## 2019-07-25 LAB — CULTURE, RESPIRATORY W GRAM STAIN

## 2019-07-25 LAB — PATHOLOGIST SMEAR REVIEW: Path Review: INCREASED

## 2019-07-30 NOTE — Progress Notes (Addendum)
CRITICAL VALUE ALERT  Critical Value: CBG 47  Date & Time Notied:  07/22/2019  Provider Notified: Dr. Carlis Abbott  Orders Received/Actions taken: Given D50. SSI and Levimir adjusted.

## 2019-07-30 NOTE — Progress Notes (Signed)
Critical ABG obtained  7.22, >97, 57, rest unreadable due to CO2. Dr Carlis Abbott made aware.Marland Kitchen No changes at this time

## 2019-07-30 NOTE — Procedures (Signed)
Extubation Procedure Note  Patient Details:   Name: Bruce Weaver DOB: August 18, 1940 MRN: [id number]   Airway Documentation:    Vent end date: 15-Aug-2019 Vent end time: 1810   Evaluation  Pt extubated to RA per Withdrawal of Life Protocol  Jesse Sans Aug 15, 2019, 6:10 PM

## 2019-07-30 NOTE — Progress Notes (Signed)
NAME:  Bruce Weaver, MRN:  [id number], DOB:  1940/08/12, LOS: 55 ADMISSION DATE:  07/18/2019, CONSULTATION DATE:  07/05/19 REFERRING MD:  Lonny Prude, CHIEF COMPLAINT:  SOB   Brief History   79 year old man with metabolic syndrome presenting with severe COVID pneumonia.  Transferred from [place].  Diagnosed on 3/2.  Progressive hypoxemia and delirium prompting transfer to [facility] and ultimately intubation for ARDS.   Past Medical History  DM2 HTN HLD Obesity  Significant Hospital Events   3/1 admitted to [facility] 3/2 diagnosed with COVID 3/5 4L to 15L, transferred to [facility] 3/6 PCCM consult 07/07/2019 required intubation 07/07/2019 starting proning at 1600 hrs. 3/13 partial dislodgment of his arterial line-did have moderate bleeding 3/15 Transfer from [place] to [place], resumed proning 3/20 prone positioning; a fib with RVR >> start amiodarone  Consults:    Procedures:  3/09 intubation>> 3/10 right IJ CVL>> 3/10 Rt radial a line >>  Significant Diagnostic Tests:  CTA OSH neg for PE  Micro Data:  3/11 blood cultures x2>>No growth 3/11 sputum culture>>Enterobacter FAECALIS , ENTEROBACTER AEROGENES  3/11 Urine>> No Growth 3/23 sputum >> Klebsiella oxytoca>>  Antimicrobials:  Remdesivir completed Ampicillin 3/16 >>3/23 Ceftriaxone 3/16 >>3/23 Meropenem 3/24>>  Interim history/subjective:  Supinated overnight. Increasing CO2 despite increasing tidal volumes and paralysis  Objective   Blood pressure (!) 105/54, pulse (!) 110, temperature (!) 96.1 F (35.6 C), temperature source Axillary, resp. rate (!) 35, height 6' (1.829 m), weight 127.1 kg, SpO2 90 %.    Vent Mode: PRVC FiO2 (%):  [100 %] 100 % Set Rate:  [35 bmp] 35 bmp Vt Set:  [460 mL-540 mL] 540 mL PEEP:  [16 cmH20] 16 cmH20 Plateau Pressure:  [29 cmH20-33 cmH20] 29 cmH20   Intake/Output Summary (Last 24 hours) at 08/01/19 0850 Last data filed at 08/01/19 0800 Gross per 24 hour  Intake 1865.31 ml  Output 2425 ml  Net  -559.69 ml   Filed Weights   07/17/19 0700 07/22/19 0500 2019-08-01 0500  Weight: 120 kg 126 kg 127.1 kg    Examination: General: Critically ill-appearing intubated, sedated, on neuromuscular blockade HENT: [place]/AT, eyes anicteric, ETT and OGT in place. Cardiac: Tachycardic, regular rhythm, no murmurs Resp: rhales bilaterally, breathing synchronously with the vent.  Plateau pressure 36.  Minimal thick secretions from ET tube Abdomen: obese, soft, NT, ND Extremities: pitting edema in all extremities, right radial A-line with good distal perfusion Skin - no rashes or bruising Neuro -RASS -5, under neuromuscular blockade, PERRL    Assessment & Plan:   Acute hypoxic, hypercapnicrespiratory failure with ARDS in setting of COVID 19 pneumonia complicated by Enterobacter HAP.  Significantly positive fluid balance this admission. Now with Klebsiella pneumonia.  Worsening hypercapnia despite aggressive ventilation not meeting ARDS goals. -Guarded prognosis.  Discussed with his wife that this is likely not a reversible process.  She indicated that he would not want to continue care he was not going to recover.  She is planning to come visit today and is considering withdrawal of aggressive care measures. -Previously completed remdesivir and Decadron -Continue low tidal volume ventilation, 4-8 cc/kg ideal body weight.  Not meeting goals. -Continue meropenem -Lasix 60 mg IV for 3 doses today, goal net negative fluid balance -Continue DuoNebs  Septic shock -Continue vasopressors as required to maintain MAP greater than 65 -Continue antibiotics for now  New onset A fib with RVR 3/20.  Back in sinus rhythm since 3/21. Back into Afib periodically on 3/24 -Continue telemetry monitoring -Amiodarone  as needed for sustained A. fib with RVR greater than [id number]  Acute metabolic encephalopathy 2nd to sepsis, hypoxia. 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   Now with hypoglycemia. -Continue basal bolus insulin with goal BG 140-180  Nasal congestion -Continue Flonase nasal spray  Anemia of critical illness, stable Continue to monitor -Transfuse for hemoglobin less than 7 or hemodynamically significant bleeding.  Hx of HTN, HLD -Continue PTA pravastatin -Holding PTA antihypertensives  AKI-possibly post renal due to Foley catheter obstruction. unlikely prerenal with positive fluid balance and peripheral edema.  UA on 3/20.  Post Foley catheter change does not appear infected, no proteinuria. Worsening. -Renally dose medications -Continue to monitor -Goal net negative fluid balance for worsening respiratory failure  Hyperkalemia-resolved -Con't to monitor  Best practice:  Diet: Tube feeds per protocol DVT prophylaxis: lovenox GI prophylaxis: pepcid Mobility: BR Code Status: Full Disposition:  ICU  Family:  updated Lorriane Shire over the phone 3/25- she will come in for a visit today  Labs:   CMP Latest Ref Rng & Units 08/07/19 07/22/2019 07/22/2019  Glucose 70 - 99 mg/dL 245(H) 120(H) -  BUN 8 - 23 mg/dL 148(H) 131(H) -  Creatinine 0.61 - 1.24 mg/dL 2.51(H) 2.34(H) -  Sodium 135 - 145 mmol/L 142 141 140  Potassium 3.5 - 5.1 mmol/L 4.0 4.1 4.0  Chloride 98 - 111 mmol/L 93(L) 95(L) -  CO2 22 - 32 mmol/L 37(H) 35(H) -  Calcium 8.9 - 10.3 mg/dL 8.6(L) 8.1(L) -  Total Protein 6.5 - 8.1 g/dL - 5.6(L) -  Total Bilirubin 0.3 - 1.2 mg/dL - 0.4 -  Alkaline Phos 38 - 126 U/L - 151(H) -  AST 15 - 41 U/L - 36 -  ALT 0 - 44 U/L - 33 -    CBC Latest Ref Rng & Units 2019-08-07 07/22/2019 07/21/2019  WBC 4.0 - 10.5 K/uL 6.8 - -  Hemoglobin 13.0 - 17.0 g/dL 8.1(L) 8.8(L) 9.2(L)  Hematocrit 39.0 - 52.0 % 28.3(L) 26.0(L) 27.0(L)  Platelets 150 - 400 K/uL 209 - -    ABG    Component Value Date/Time   PHART 7.174 (LL) 2019-08-07 0528   PCO2ART 106 (HH) August 07, 2019 0528   PO2ART 76.3 (L) 07-Aug-2019 0528   HCO3 37.4 (H) 07-Aug-2019 0528   TCO2 44  (H) 07/22/2019 0641   O2SAT 91.3 2019-08-07 0528    CBG (last 3)  Recent Labs    07/22/19 2325 Aug 07, 2019 0324 08-07-2019 0741  GLUCAP 245* 262* 185*     This patient is critically ill with multiple organ system failure which requires frequent high complexity decision making, assessment, support, evaluation, and titration of therapies. This was completed through the application of advanced monitoring technologies and extensive interpretation of multiple databases. During this encounter critical care time was devoted to patient care services described in this note for 38 minutes.   Julian Hy, DO 08/07/19 9:11 AM [facility]

## 2019-07-30 NOTE — Progress Notes (Signed)
eLink Physician-Brief Progress Note Patient Name: Bruce Weaver DOB: May 26, 1940 MRN: [id number]   Date of Service  07/31/19  HPI/Events of Note  7.17/ 106/ 76.3/ 97 %  RR 35  eICU Interventions  Only option is to increase Vt from 6 ml/ kg to 7 ml/kg. ABG at 7:30 a.m.        Kerry Kass Dianne Bady July 31, 2019, 6:25 AM

## 2019-07-30 NOTE — Plan of Care (Signed)
Family coming for terminal visit today and to withdraw care. Code status changed to DNR. Will put withdrawal of care orders in place when family have had time to visit and are prepared.  Julian Hy, DO August 06, 2019 11:53 AM [facility]

## 2019-07-30 NOTE — Care Plan (Signed)
Family at bedside. They understand his prognosis and agree with plans for terminal extubation. Orders placed and discussed with his nurse.  Julian Hy, DO 08-17-2019 4:07 PM [facility]

## 2019-07-30 NOTE — Progress Notes (Signed)
Call to RT made aware of ABG, stated he will call ELink.

## 2019-07-30 NOTE — Progress Notes (Signed)
Patient supinated per orders, tolerated fairly well. Assist of 3 RNs and RT.

## 2019-07-30 NOTE — Death Summary Note (Signed)
DEATH SUMMARY   Patient Details  Name: Bruce Weaver MRN: [id number] DOB: 12-12-1940  Admission/Discharge Information   Admit Date:  2019-07-14  Date of Death:   08/02/19  Time of Death:   18:18  Length of Stay: 19  Referring Physician: Patient, [name]   Reason(s) for Hospitalization  Covid-19 viral pneumonia  Diagnoses  Preliminary cause of death:  Secondary Diagnoses (including complications and co-morbidities):  Principal Problem:   Pneumonia due to COVID-19 virus Active Problems:   DM2 (diabetes mellitus, type 2) ([place])   HTN (hypertension)   Acute respiratory failure ([place])   AKI (acute kidney injury) ([place])   Delirium due to another medical condition   Hypoxia ARDS Enterobacter pneumonia Klebsiella pneumonia New on-set Afib with RVR Septic shock Acute metabolic encephalopathy Anemia of critical illness Hyperkalemia   Brief Hospital Course (including significant findings, care, treatment, and services provided and events leading to death)  Bruce Weaver is a 79 y.o. year old male who was admitted with acute respiratory failure due to pneumonia. He developed progressive respiratory failure requiring mechanical ventilation. Despite aggressive care with remdesivir, dexamethasone, mechanical ventilation per ARDS protocol, prone positioning he developed progressive hypoxic respiratory failure. He developed Enterobacter and Klebsiella pneumonia and septic shock requiring antibiotics and vasopressors. He received supportive care for his diabetes, AKI, Afib with RVR. Due to progressive hypoxia refractory to usual care, family made the decision to withdraw aggressive care. His children and wife were able to visit before aggressive care measures were withdrawn. He was terminally extubated on 2022/08/02 and passed away minutes later.    Pertinent Labs and Studies  Significant Diagnostic Studies  CXR 08-02-19: diffuse bilateral infiltrates, most predominant in bilateral bases,  ETT in place.  Microbiology Recent Results (from the past 240 hour(s))  Culture, respiratory (non-expectorated)     Status: None (Preliminary result)   Collection Time: 07/21/19 11:45 AM   Specimen: Tracheal Aspirate; Respiratory  Result Value Ref Range Status   Specimen Description TRACHEAL ASPIRATE  Final   Special Requests NONE  Final   Gram Stain   Final    ABUNDANT WBC PRESENT,BOTH PMN AND MONONUCLEAR RARE GRAM POSITIVE COCCI RARE YEAST    Culture   Final    FEW KLEBSIELLA OXYTOCA CULTURE REINCUBATED FOR BETTER GROWTH Performed at [facility], [place] [address], [place], [place] [address]    Report Status PENDING  Incomplete   Organism ID, Bacteria KLEBSIELLA OXYTOCA  Final      Susceptibility   Klebsiella oxytoca - MIC*    AMPICILLIN >=32 RESISTANT Resistant     CEFAZOLIN 8 SENSITIVE Sensitive     CEFEPIME <=0.12 SENSITIVE Sensitive     CEFTAZIDIME <=1 SENSITIVE Sensitive     CEFTRIAXONE <=0.25 SENSITIVE Sensitive     CIPROFLOXACIN <=0.25 SENSITIVE Sensitive     GENTAMICIN <=1 SENSITIVE Sensitive     IMIPENEM <=0.25 SENSITIVE Sensitive     TRIMETH/SULFA <=20 SENSITIVE Sensitive     AMPICILLIN/SULBACTAM 8 SENSITIVE Sensitive     PIP/TAZO <=4 SENSITIVE Sensitive     * FEW KLEBSIELLA OXYTOCA    Lab Basic Metabolic Panel: Recent Labs  Lab 07/17/19 0344 07/17/19 2039 07/18/19 0432 07/18/19 2331 07/19/19 0419 07/19/19 0419 07/20/19 0431 07/20/19 0431 07/21/19 0358 07/21/19 0603 07/21/19 1301 07/21/19 1541 07/22/19 0641 07/22/19 0815 08/02/2019 0508  NA 146*   < > 146*   < > 143   < > 141   < > 140   < > 139 139  140 141 142  K 4.2   < > 4.3   < > 4.6   < > 5.2*   < > 4.9   < > 4.2 4.2 4.0 4.1 4.0  CL 96*  --  98  --  97*  --  93*  --  92*  --   --   --   --  95* 93*  CO2 37*  --  39*  --  38*  --  36*  --  37*  --   --   --   --  35* 37*  GLUCOSE 213*  --  133*  --  274*  --  252*  --  167*  --   --   --   --  120* 245*  BUN 49*  --  54*  --  61*  --   88*  --  114*  --   --   --   --  131* 148*  CREATININE 1.03  --  1.01  --  1.04  --  1.72*  --  2.18*  --   --   --   --  2.34* 2.51*  CALCIUM 9.0  --  9.0  --  8.7*  --  8.9  --  8.7*  --   --   --   --  8.1* 8.6*  MG 2.0  --  2.2  --   --   --   --   --  2.7*  --   --   --   --  2.6*  --   PHOS 4.9*  --  4.5  --   --   --   --   --   --   --   --   --   --  6.4*  --    < > = values in this interval not displayed.   Liver Function Tests: Recent Labs  Lab 07/22/19 0815  AST 36  ALT 33  ALKPHOS 151*  BILITOT 0.4  PROT 5.6*  ALBUMIN 1.2*   No results for input(s): LIPASE, AMYLASE in the last 168 hours. No results for input(s): AMMONIA in the last 168 hours. CBC: Recent Labs  Lab 07/18/19 0432 07/18/19 2331 07/19/19 0419 07/19/19 0419 07/20/19 0431 07/20/19 0431 07/21/19 0358 07/21/19 0358 07/21/19 0603 07/21/19 1301 07/21/19 1541 07/22/19 0641 08-04-2019 0508  WBC 9.7  --  9.7  --  9.8  --  8.4  --   --   --   --   --  6.8  HGB 9.1*   < > 9.2*   < > 9.1*   < > 8.9*   < > 9.2* 9.2* 9.2* 8.8* 8.1*  HCT 30.4*   < > 31.1*   < > 32.0*   < > 31.1*   < > 27.0* 27.0* 27.0* 26.0* 28.3*  MCV 102.7*  --  104.7*  --  108.1*  --  105.4*  --   --   --   --   --  108.4*  PLT 212  --  219  --  228  --  231  --   --   --   --   --  209   < > = values in this interval not displayed.   Cardiac Enzymes: No results for input(s): CKTOTAL, CKMB, CKMBINDEX, TROPONINI in the last 168 hours. Sepsis Labs: Recent Labs  Lab 07/19/19 0419 07/20/19 0431 07/21/19 0358 08-04-19 0508  WBC 9.7  9.8 8.4 6.8    Procedures/Operations   Endotracheal intubation 07/08/19 A-line insertion 07/08/19 CVC 07/08/19    Julian Hy 08/10/2019, 6:18 PM

## 2019-07-30 NOTE — Progress Notes (Signed)
Family was at bedside at time of death. Patient was palliatively extubated prior to passing away.   Patient's time of death is 85 on 08/05/19. TOD was verified by Geroge Baseman and Gladys Damme. RNs listened for heart and lung sounds.   Dr. Carlis Abbott made aware of patient's time of death.   Patient belongings were returned to wife and children. Bed placement card was given to the wife when they've decided on a funeral home.

## 2019-07-30 DEATH — deceased

## 2021-05-21 IMAGING — DX DG CHEST 1V PORT
1 series · 1 of 1 positions shown · non-contrast
Comparison: Prior today

CLINICAL DATA: Acute respiratory failure. Endotracheally intubated.
Central line placement.

EXAM:
PORTABLE CHEST 1 VIEW

[chest ap]
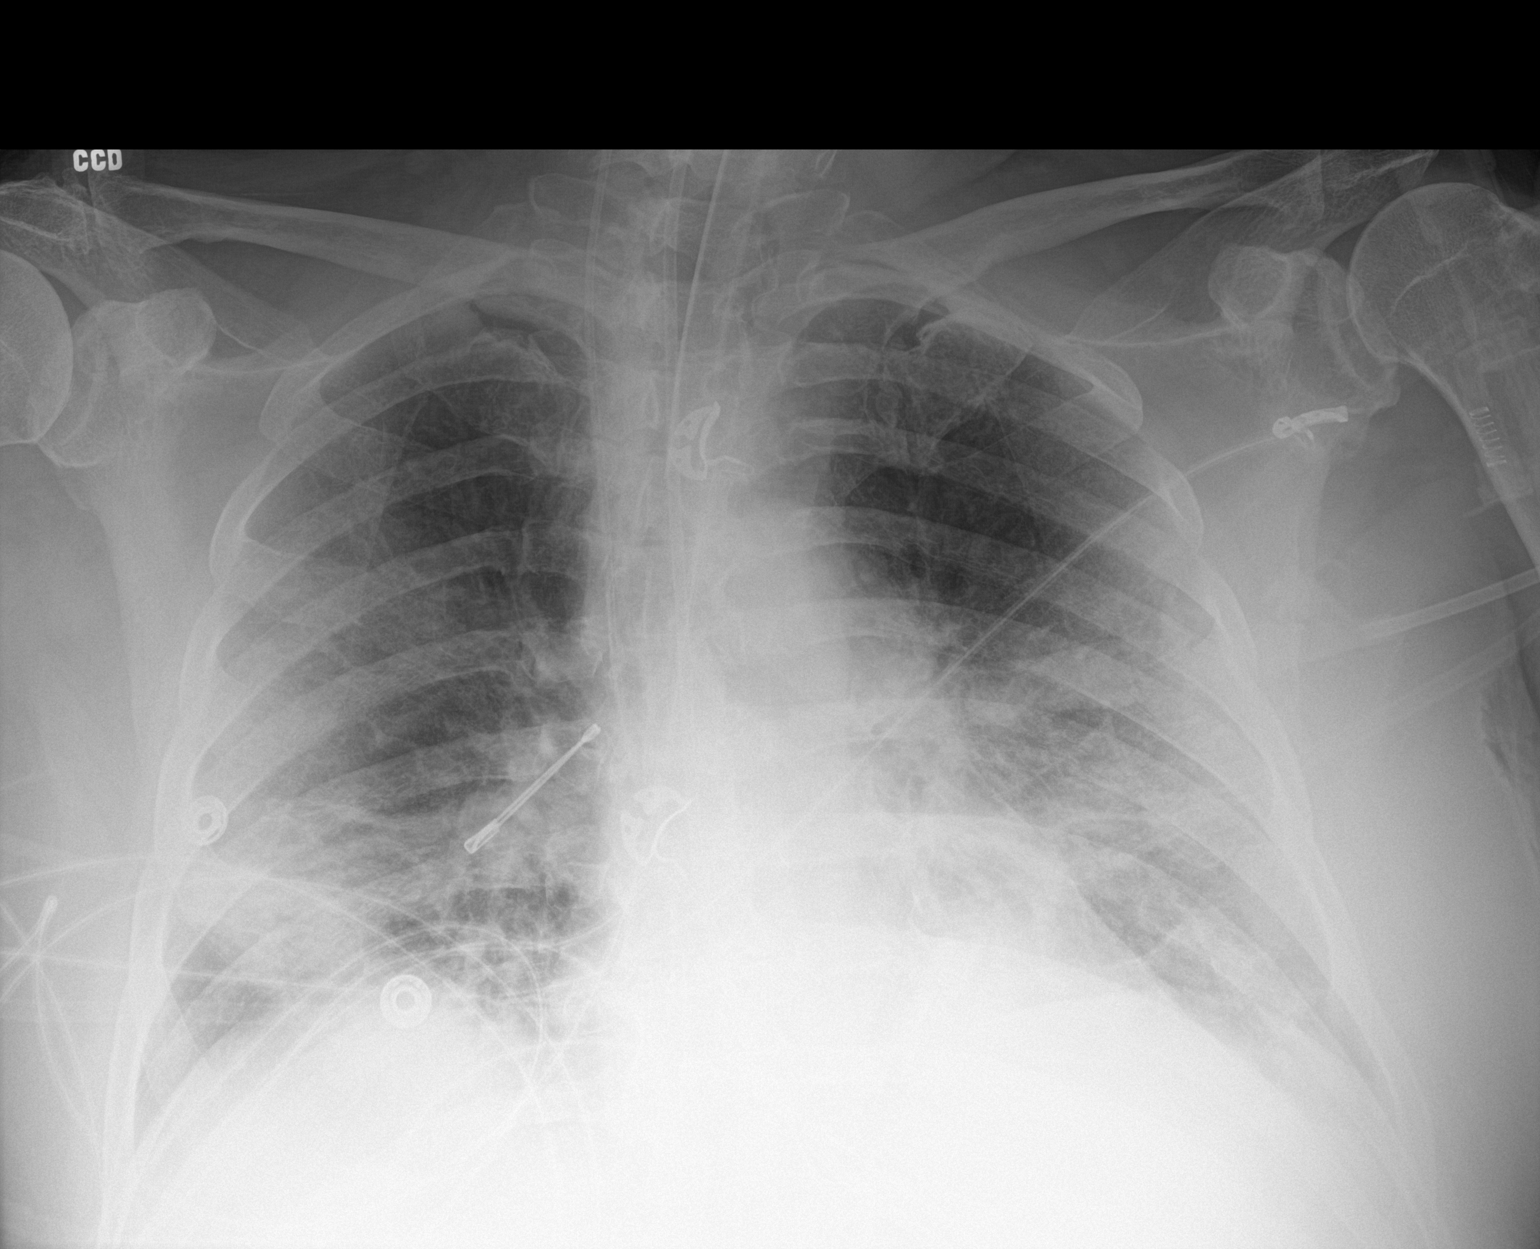

[1 of 1 positions shown; findings below may reference images not displayed]

FINDINGS: A new right jugular central venous catheter is seen with tip
overlying the superior cavoatrial junction. No evidence of
pneumothorax. Endotracheal tube and feeding tube remain in place.

Improved aeration of both lungs is seen. Bilateral lower lung
airspace disease shows no significant change. Heart size is normal.
IMPRESSION: 1. New right jugular central venous catheter in appropriate
position. No evidence of pneumothorax.
2. Bilateral lower lung airspace disease, without significant
change.

## 2021-05-22 IMAGING — DX DG CHEST 1V
1 series · 2 of 2 positions shown · non-contrast
Comparison: Chest radiograph 07/09/2019

CLINICAL DATA: Evaluate ET tube placement.

EXAM:
CHEST  1 VIEW

[Series 1: chest ap · 0.14mm/px · 2 of 2 slices shown]
[im 1/2]
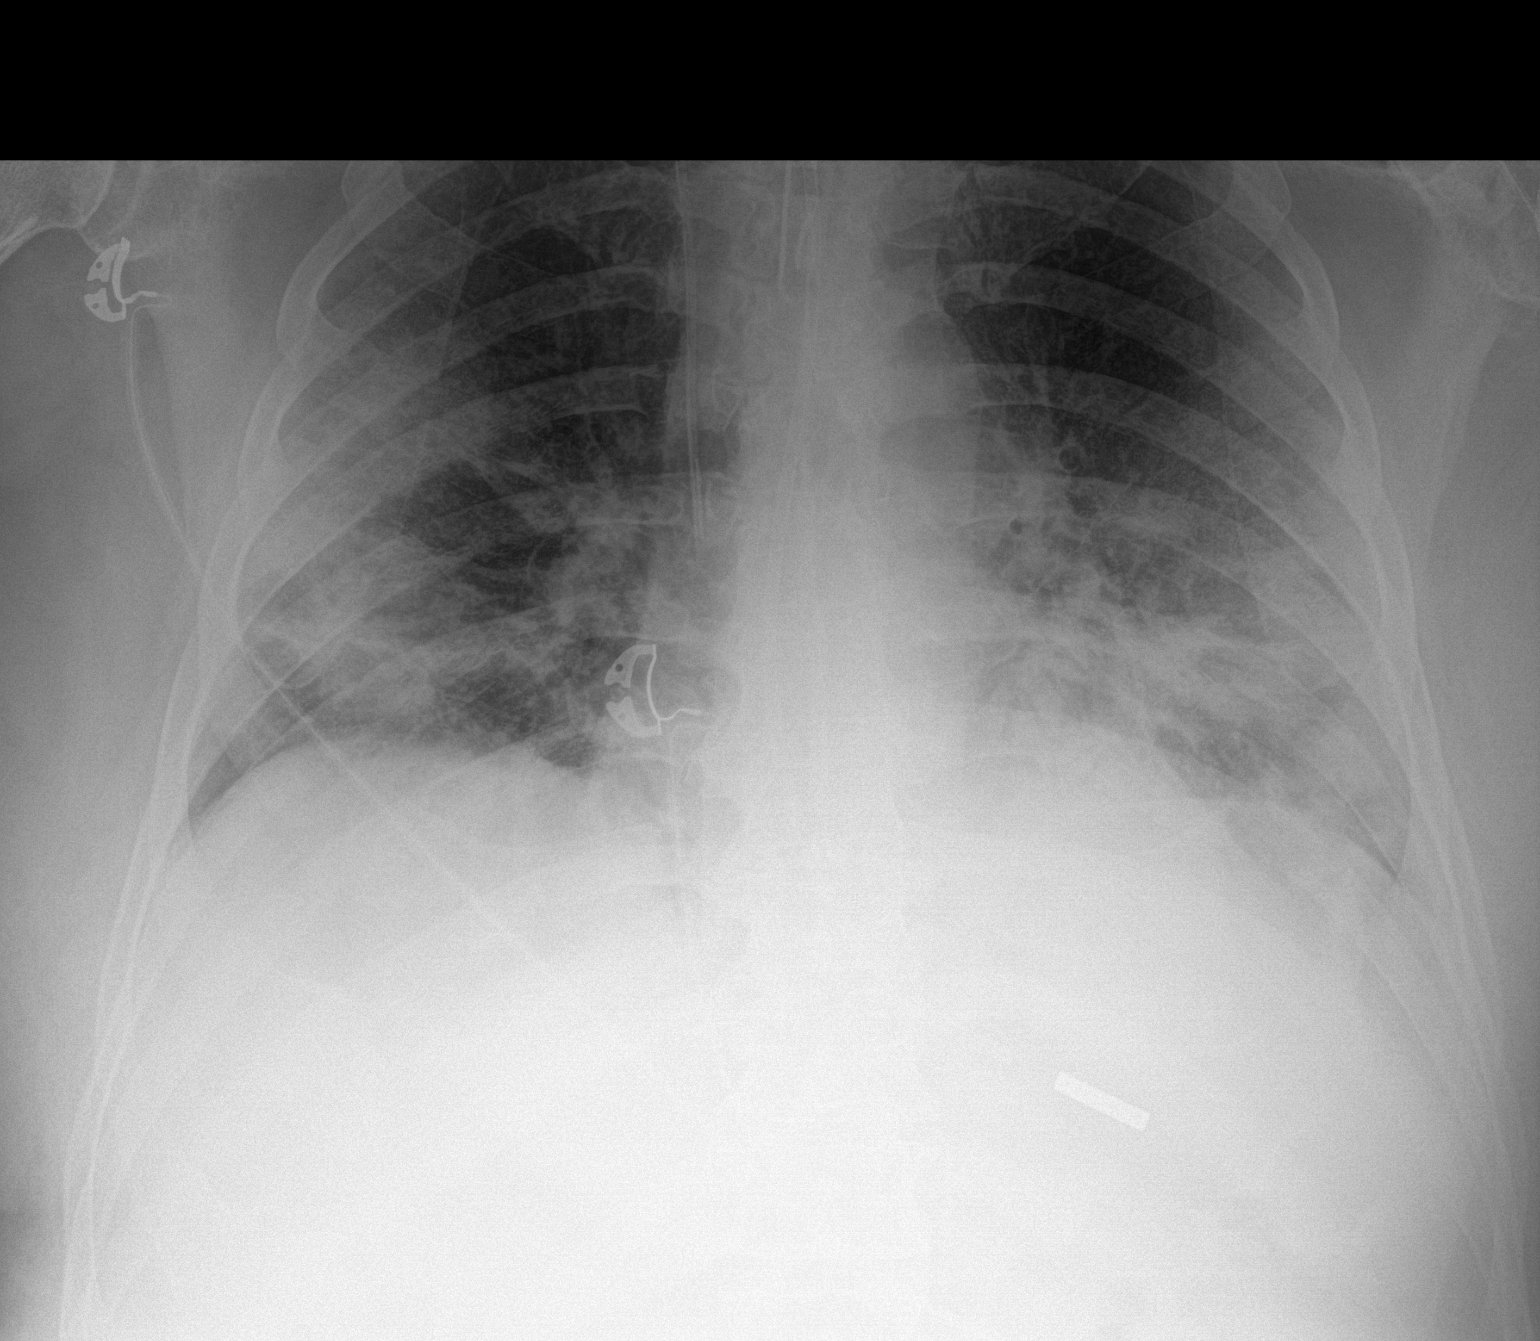
[im 2/2]
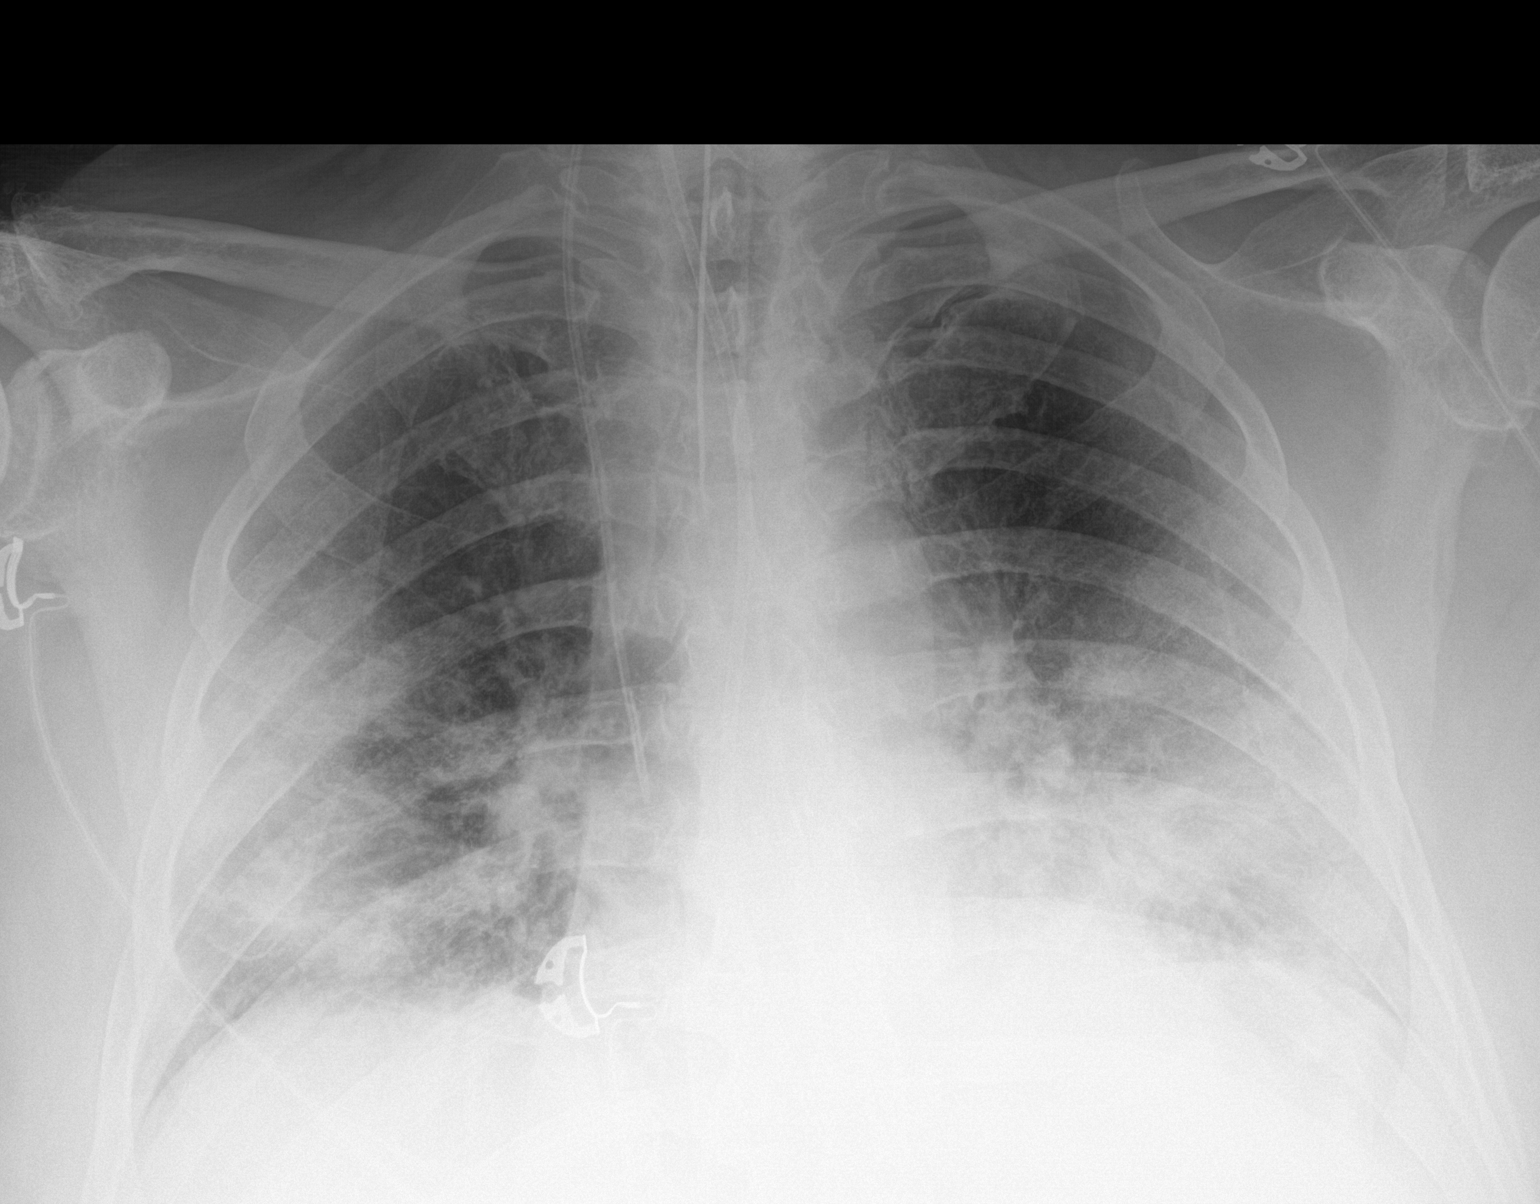

[2 of 2 positions shown; findings below may reference images not displayed]

FINDINGS: ET tube terminates in the mid trachea. Right IJ central venous
catheter tip projects over the superior vena cava. Enteric tube
projects over the stomach. Monitoring leads overlie the patient.
Stable cardiac and mediastinal contours. Similar diffuse bilateral
airspace opacities. Thoracic spine degenerative changes.
IMPRESSION: ET tube terminates in the mid trachea.

Similar diffuse bilateral airspace opacities.

## 2021-05-22 IMAGING — DX DG ABDOMEN 1V
1 series · 1 of 1 positions shown · non-contrast
Comparison: July 06, 2019.

CLINICAL DATA: Tube placement.

EXAM:
ABDOMEN - 1 VIEW

[abdomen kub]
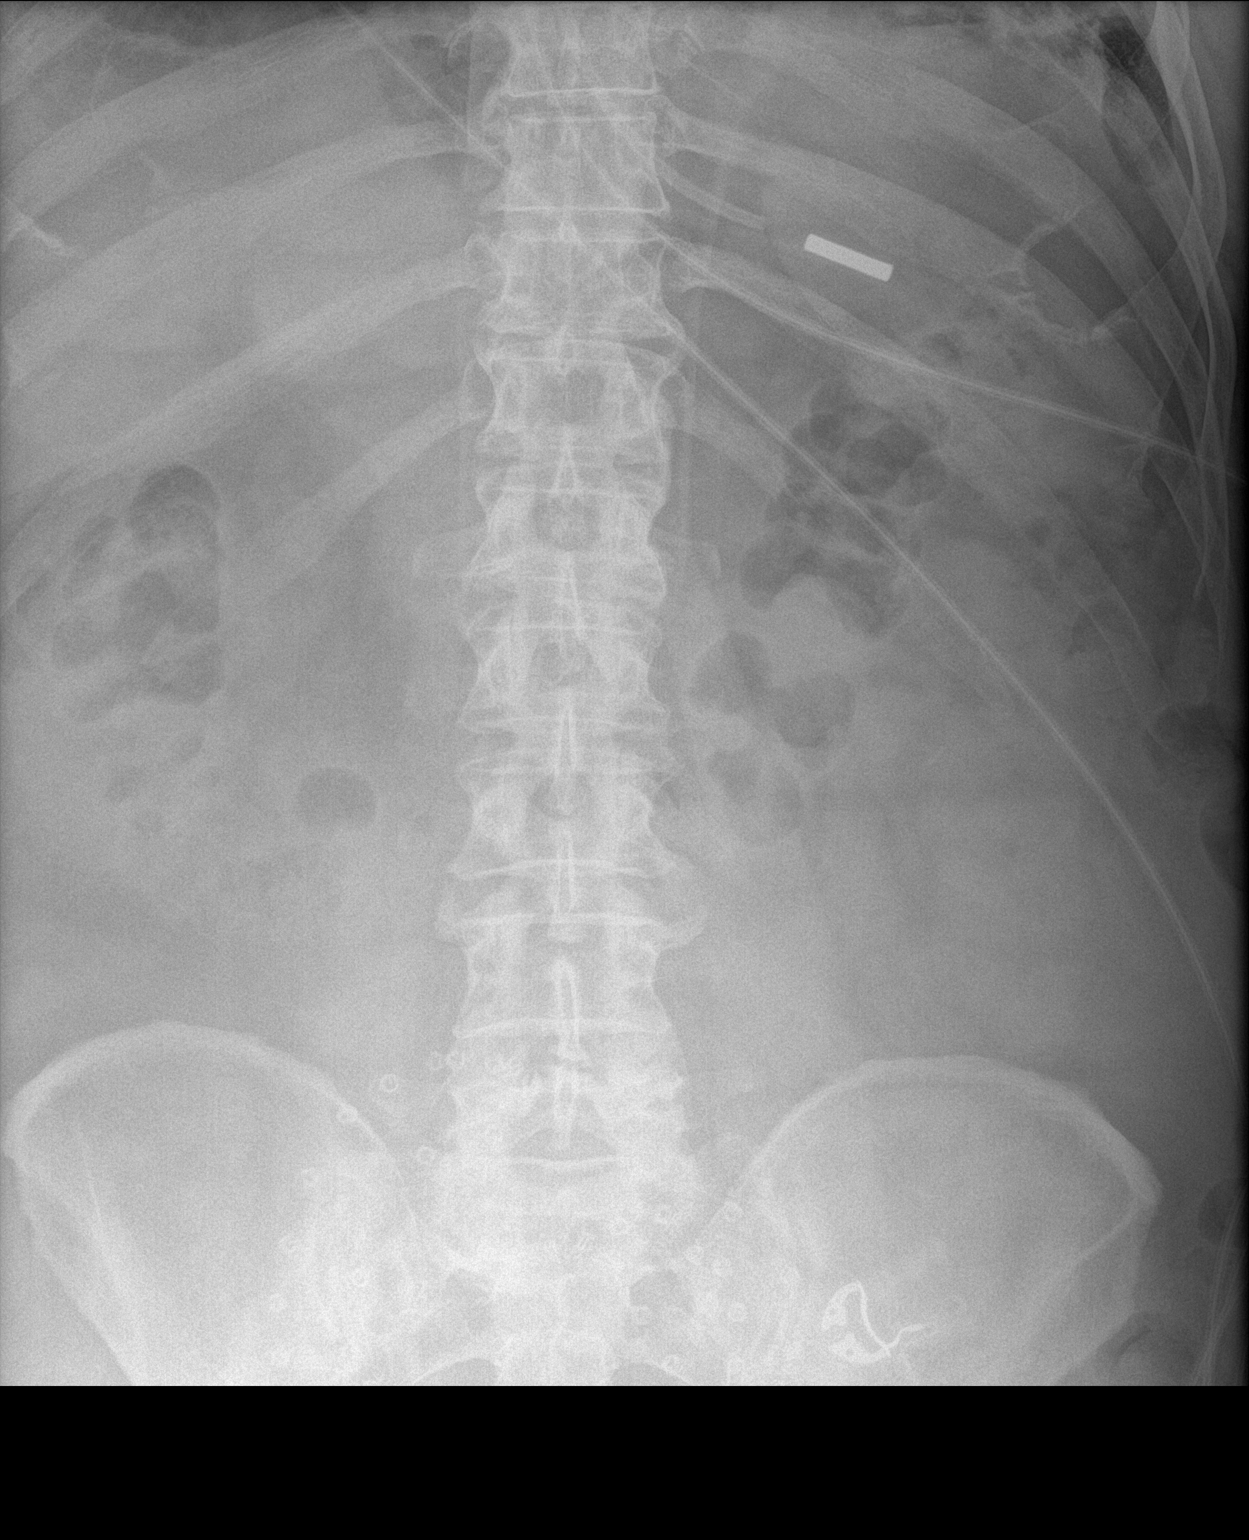

[1 of 1 positions shown; findings below may reference images not displayed]

FINDINGS: The bowel gas pattern is normal. Distal tip of feeding tube appears
to be in proximal stomach. No radio-opaque calculi or other
significant radiographic abnormality are seen.
IMPRESSION: Distal tip of feeding tube seen in proximal stomach.

## 2021-05-24 IMAGING — DX DG CHEST 1V PORT
1 series · 1 of 1 positions shown · non-contrast
Comparison: 07/10/2019

CLINICAL DATA: Respiratory failure

EXAM:
PORTABLE CHEST 1 VIEW

[chest ap]
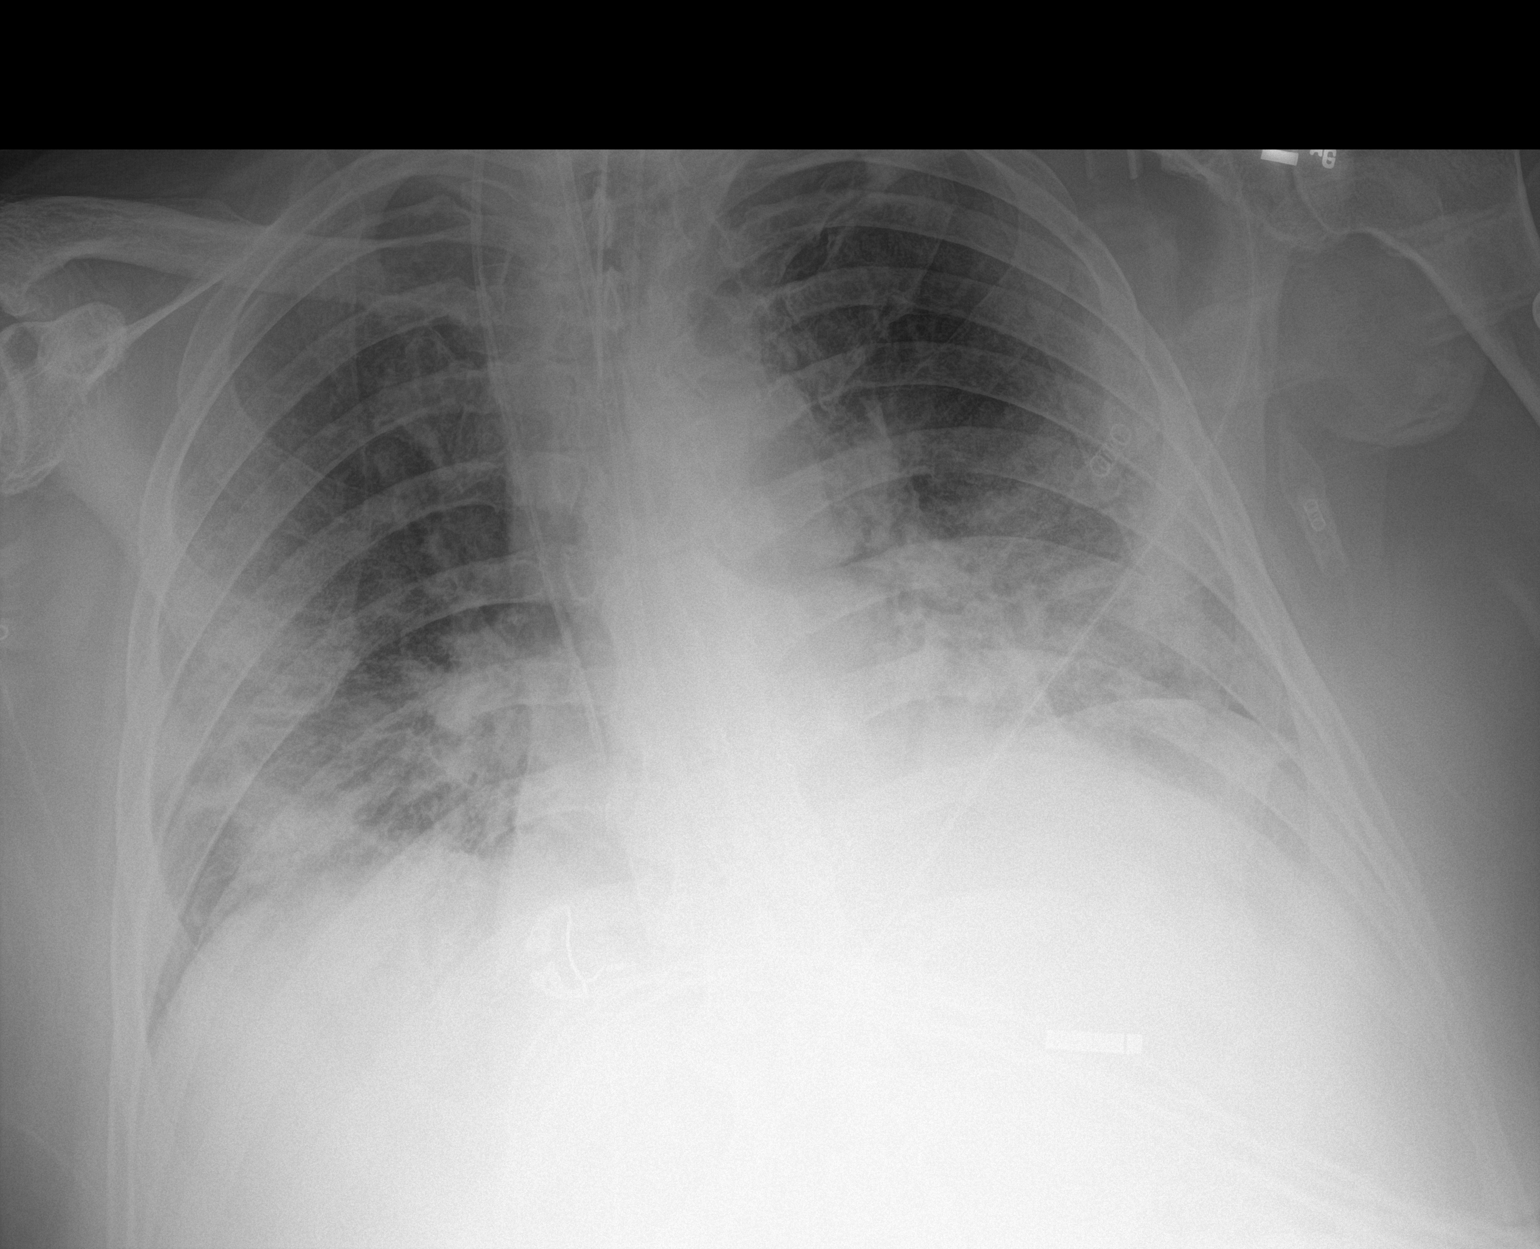

[1 of 1 positions shown; findings below may reference images not displayed]

FINDINGS: Endotracheal tube terminates 5.5 cm above the carina.

Right IJ venous catheter terminates in the right atrium, 2 cm focal
low the cavoatrial junction.

Enteric tube terminates in the proximal stomach.

Multifocal patchy opacities in the mid/lower lungs bilaterally,
suggesting multifocal pneumonia. No pleural effusion or
pneumothorax.

Heart is normal in size.
IMPRESSION: Endotracheal tube terminates 5.5 cm above the carina.

Stable multifocal pneumonia.

Additional stable support apparatus as above.

## 2021-05-27 IMAGING — DX DG CHEST 1V PORT
1 series · 1 of 1 positions shown · non-contrast
Comparison: Radiograph 07/13/2019

CLINICAL DATA: Follow-up COVID positive patient

EXAM:
PORTABLE CHEST 1 VIEW

[chest ap]
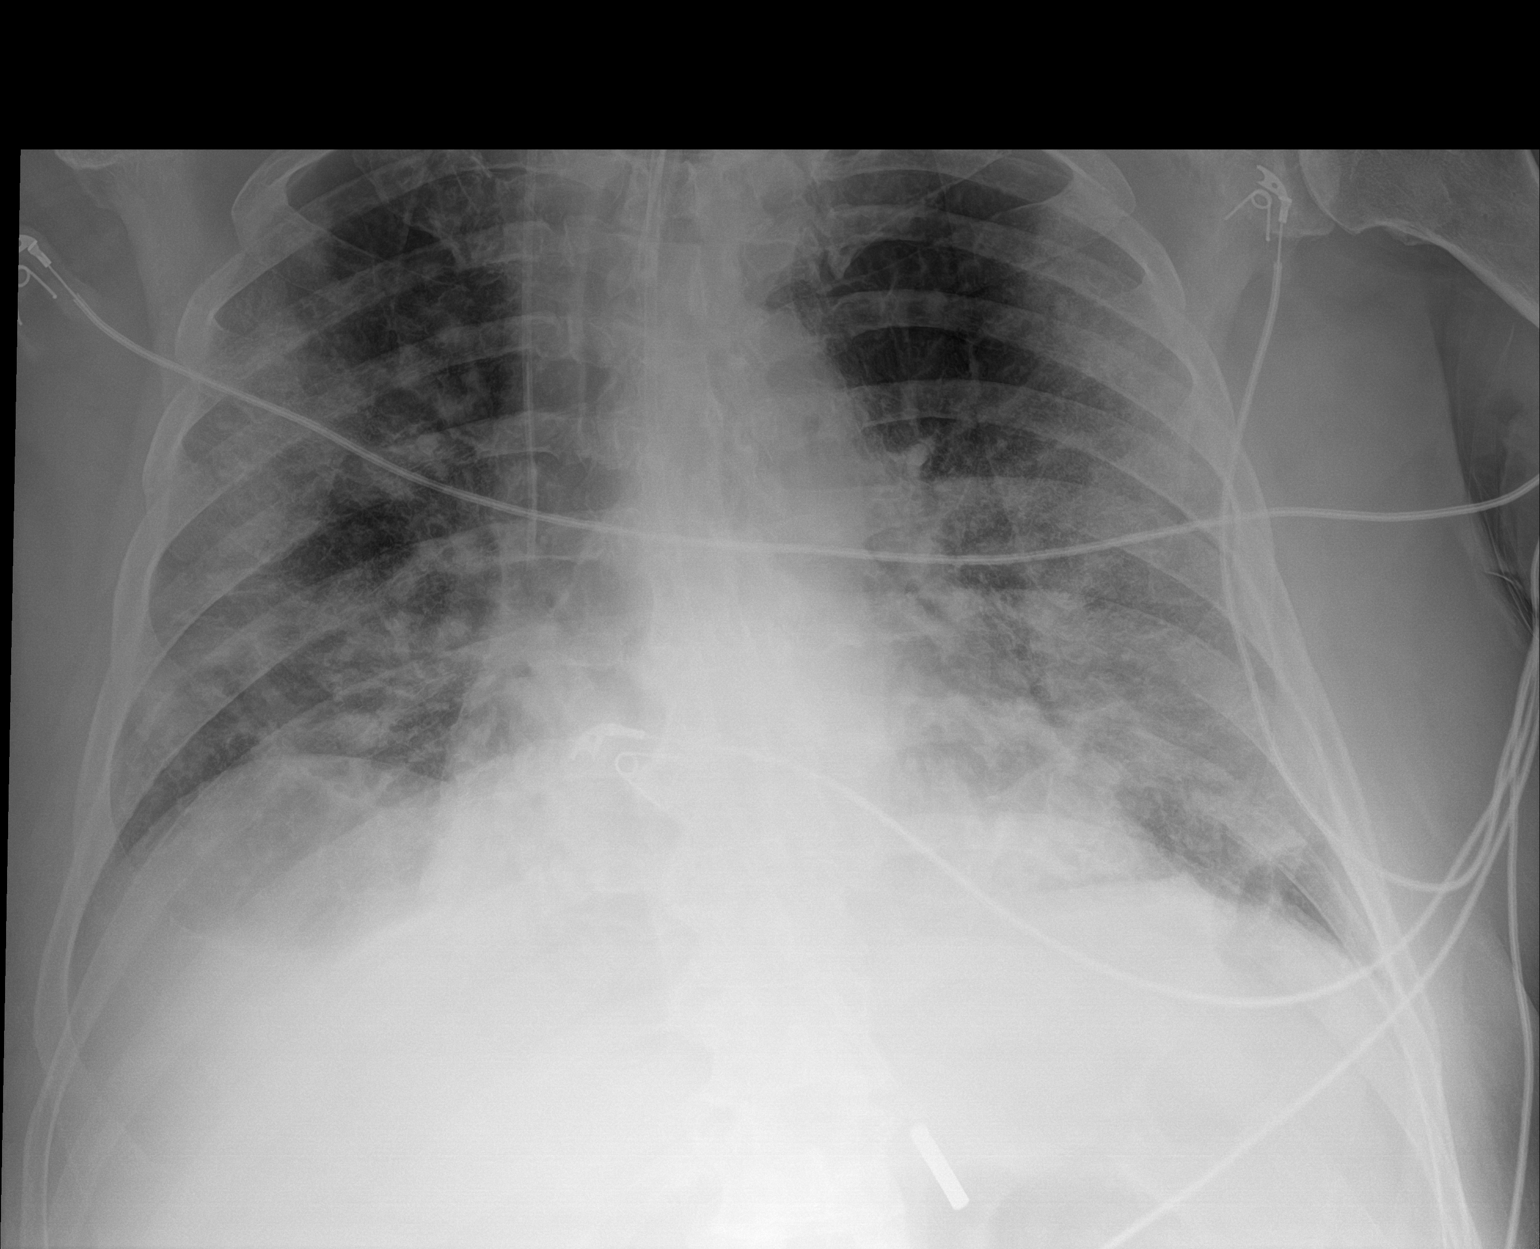

[1 of 1 positions shown; findings below may reference images not displayed]

FINDINGS: Endotracheal tube is approximately 6 cm from carina which is
slightly retracted from 4 cm. Feeding tube with weighted tip in the
stomach. Central venous line unchanged.

Stable cardiac silhouette.

There is bilateral diffuse patchy airspace disease unchanged.
IMPRESSION: 1. Support apparatus as above.
2. No change in peripheral patchy airspace disease consistent with
COVID pneumonia
